# Patient Record
Sex: Female | Born: 1970
Health system: Southern US, Community
[De-identification: ages and names within clinical notes are randomized; demographics above are authoritative.]

## PROBLEM LIST (undated history)

## (undated) DIAGNOSIS — G35 Multiple sclerosis: Secondary | ICD-10-CM

## (undated) DIAGNOSIS — Z720 Tobacco use: Secondary | ICD-10-CM

## (undated) DIAGNOSIS — R002 Palpitations: Secondary | ICD-10-CM

## (undated) DIAGNOSIS — H469 Unspecified optic neuritis: Secondary | ICD-10-CM

## (undated) HISTORY — DX: Tobacco use: Z72.0

## (undated) HISTORY — DX: Multiple sclerosis: G35

## (undated) HISTORY — DX: Palpitations: R00.2

## (undated) HISTORY — PX: VEIN SURGERY: SHX48

## (undated) HISTORY — PX: GALLBLADDER SURGERY: SHX652

## (undated) HISTORY — PX: CHOLECYSTECTOMY: SHX55

## (undated) HISTORY — DX: Unspecified optic neuritis: H46.9

---

## 1999-11-23 ENCOUNTER — Ambulatory Visit (HOSPITAL_COMMUNITY): Admission: RE | Admit: 1999-11-23 | Discharge: 1999-11-23 | Payer: Self-pay | Admitting: Internal Medicine

## 1999-11-23 ENCOUNTER — Encounter: Payer: Self-pay | Admitting: Internal Medicine

## 2008-05-10 HISTORY — PX: VEIN SURGERY: SHX48

## 2008-05-23 ENCOUNTER — Encounter: Payer: Self-pay | Admitting: Cardiology

## 2009-05-10 HISTORY — PX: GALLBLADDER SURGERY: SHX652

## 2009-12-04 ENCOUNTER — Emergency Department (HOSPITAL_COMMUNITY): Admission: EM | Admit: 2009-12-04 | Discharge: 2009-12-04 | Payer: Self-pay | Admitting: Emergency Medicine

## 2010-02-09 ENCOUNTER — Encounter: Admission: RE | Admit: 2010-02-09 | Discharge: 2010-02-09 | Payer: Self-pay | Admitting: Ophthalmology

## 2010-03-11 ENCOUNTER — Ambulatory Visit (HOSPITAL_COMMUNITY): Admission: RE | Admit: 2010-03-11 | Discharge: 2010-03-11 | Payer: Self-pay | Admitting: General Surgery

## 2010-07-22 LAB — SURGICAL PCR SCREEN
MRSA, PCR: NEGATIVE
Staphylococcus aureus: NEGATIVE

## 2010-07-25 LAB — COMPREHENSIVE METABOLIC PANEL
BUN: 21 mg/dL (ref 6–23)
CO2: 28 mEq/L (ref 19–32)
Chloride: 106 mEq/L (ref 96–112)
Creatinine, Ser: 0.65 mg/dL (ref 0.4–1.2)
GFR calc non Af Amer: >60 mL/min (ref >60)
Glucose, Bld: 123 mg/dL — ABNORMAL HIGH (ref 70–99)
Total Bilirubin: 0.7 mg/dL (ref 0.3–1.2)

## 2010-07-25 LAB — DIFFERENTIAL
Basophils Absolute: 0.1 10*3/uL (ref 0.0–0.1)
Eosinophils Relative: 2 % (ref 0–5)
Lymphocytes Relative: 9 % — ABNORMAL LOW (ref 12–46)
Neutro Abs: 11.1 10*3/uL — ABNORMAL HIGH (ref 1.7–7.7)

## 2010-07-25 LAB — URINALYSIS, ROUTINE W REFLEX MICROSCOPIC
Bilirubin Urine: NEGATIVE
Nitrite: NEGATIVE
Specific Gravity, Urine: 1.029 (ref 1.005–1.030)
Urobilinogen, UA: 0.2 mg/dL (ref 0.0–1.0)

## 2010-07-25 LAB — CBC
Hemoglobin: 13.8 g/dL (ref 12.0–15.0)
MCH: 32.9 pg (ref 26.0–34.0)
MCV: 94.7 fL (ref 78.0–100.0)
RBC: 4.2 MIL/uL (ref 3.87–5.11)

## 2010-07-25 LAB — LIPASE, BLOOD: Lipase: 24 U/L (ref 11–59)

## 2011-02-11 ENCOUNTER — Other Ambulatory Visit: Payer: Self-pay | Admitting: Physician Assistant

## 2011-02-11 ENCOUNTER — Other Ambulatory Visit (HOSPITAL_COMMUNITY)
Admission: RE | Admit: 2011-02-11 | Discharge: 2011-02-11 | Disposition: A | Discharge status: Home / Self Care | Payer: BC Managed Care – PPO | Source: Ambulatory Visit | Attending: Family Medicine | Admitting: Family Medicine

## 2011-02-11 DIAGNOSIS — Z1159 Encounter for screening for other viral diseases: Secondary | ICD-10-CM | POA: Insufficient documentation

## 2011-02-11 DIAGNOSIS — Z0000 Encounter for general adult medical examination without abnormal findings: Secondary | ICD-10-CM | POA: Insufficient documentation

## 2011-10-13 ENCOUNTER — Encounter: Payer: Self-pay | Admitting: Cardiology

## 2012-02-04 ENCOUNTER — Other Ambulatory Visit: Payer: Self-pay | Admitting: Ophthalmology

## 2012-02-04 DIAGNOSIS — H547 Unspecified visual loss: Secondary | ICD-10-CM

## 2012-02-07 ENCOUNTER — Ambulatory Visit
Admission: RE | Admit: 2012-02-07 | Discharge: 2012-02-07 | Disposition: A | Discharge status: Home / Self Care | Payer: BC Managed Care – PPO | Source: Ambulatory Visit | Attending: Ophthalmology | Admitting: Ophthalmology

## 2012-02-07 DIAGNOSIS — H547 Unspecified visual loss: Secondary | ICD-10-CM

## 2012-02-07 MED ORDER — GADOBENATE DIMEGLUMINE 529 MG/ML IV SOLN
17.0000 mL | Freq: Once | INTRAVENOUS | Status: AC | PRN
  Administered 2012-02-07: 17 mL via INTRAVENOUS

## 2012-04-14 ENCOUNTER — Other Ambulatory Visit (HOSPITAL_COMMUNITY)
Admission: RE | Admit: 2012-04-14 | Discharge: 2012-04-14 | Disposition: A | Discharge status: Home / Self Care | Payer: BC Managed Care – PPO | Source: Ambulatory Visit | Attending: Family Medicine | Admitting: Family Medicine

## 2012-04-14 ENCOUNTER — Other Ambulatory Visit: Payer: Self-pay | Admitting: Physician Assistant

## 2012-04-14 DIAGNOSIS — Z0000 Encounter for general adult medical examination without abnormal findings: Secondary | ICD-10-CM | POA: Insufficient documentation

## 2012-05-12 ENCOUNTER — Other Ambulatory Visit: Payer: Self-pay | Admitting: Neurology

## 2012-05-12 DIAGNOSIS — H469 Unspecified optic neuritis: Secondary | ICD-10-CM

## 2012-05-15 ENCOUNTER — Ambulatory Visit
Admission: RE | Admit: 2012-05-15 | Discharge: 2012-05-15 | Disposition: A | Discharge status: Home / Self Care | Payer: PRIVATE HEALTH INSURANCE | Source: Ambulatory Visit | Attending: Neurology | Admitting: Neurology

## 2012-05-15 ENCOUNTER — Other Ambulatory Visit: Payer: Self-pay | Admitting: Neurology

## 2012-05-15 VITALS — BP 104/55 | HR 63

## 2012-05-15 DIAGNOSIS — H469 Unspecified optic neuritis: Secondary | ICD-10-CM

## 2012-05-15 NOTE — Progress Notes (Signed)
Blood drawn to go with spinal fluid procedure, blood taken from right AC, site unremarkable and pt tolerated procedure well.

## 2012-05-19 LAB — CSF PANEL II
Total Protein, CSF: 32 mg/dL (ref 15–45)
Tube #: 4
WBC, CSF: 0 cu mm (ref 0–5)

## 2012-05-24 LAB — CNS IGG SYNTHESIS RATE, CSF+BLOOD
Albumin, CSF: 15 mg/dL (ref 8.0–42.0)
IgG Index, CSF: 0.76 — ABNORMAL HIGH (ref <0.66)
IgG, CSF: 2.2 mg/dL (ref 0.8–7.7)
IgG, Serum: 833 mg/dL (ref 694–1618)
MS CNS IgG Synthesis Rate: 1.3 mg/24 h (ref <=3.3)

## 2012-06-24 ENCOUNTER — Other Ambulatory Visit: Payer: Self-pay

## 2012-07-26 ENCOUNTER — Telehealth: Payer: Self-pay

## 2012-07-26 NOTE — Telephone Encounter (Signed)
Stephanie Mcneil, please give her an appointment to discuss treatment, finish Tecfidera she has.

## 2012-07-26 NOTE — Telephone Encounter (Signed)
Spoke with patient and told her to count ine tecfidera and she is coming for follow  up  04/01/  2014 at 8:00

## 2012-07-26 NOTE — Telephone Encounter (Addendum)
Patient states her tecfidera was denied. Patient has 20 tablets left. Patient wants to know will she be approved  Or does she need something else ?

## 2012-07-31 ENCOUNTER — Telehealth: Payer: Self-pay

## 2012-07-31 NOTE — Telephone Encounter (Signed)
Patient called wanted to no if her Tecfidera was approved.  Spoke with Samule Ohm was approved now. Patient was informed.

## 2012-08-08 ENCOUNTER — Encounter: Payer: Self-pay | Admitting: Neurology

## 2012-08-08 ENCOUNTER — Ambulatory Visit: Payer: Self-pay | Admitting: Neurology

## 2012-08-08 DIAGNOSIS — H469 Unspecified optic neuritis: Secondary | ICD-10-CM | POA: Insufficient documentation

## 2012-08-08 DIAGNOSIS — G35 Multiple sclerosis: Secondary | ICD-10-CM

## 2012-10-31 ENCOUNTER — Ambulatory Visit (INDEPENDENT_AMBULATORY_CARE_PROVIDER_SITE_OTHER): Payer: No Typology Code available for payment source | Admitting: Neurology

## 2012-10-31 ENCOUNTER — Encounter: Payer: Self-pay | Admitting: Neurology

## 2012-10-31 VITALS — BP 102/61 | HR 78 | Ht 66.5 in | Wt 189.0 lb

## 2012-10-31 DIAGNOSIS — G35 Multiple sclerosis: Secondary | ICD-10-CM | POA: Insufficient documentation

## 2012-10-31 DIAGNOSIS — F17200 Nicotine dependence, unspecified, uncomplicated: Secondary | ICD-10-CM

## 2012-10-31 DIAGNOSIS — Z720 Tobacco use: Secondary | ICD-10-CM | POA: Insufficient documentation
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
## 2013-03-15 ENCOUNTER — Other Ambulatory Visit: Payer: Self-pay

## 2013-03-26 ENCOUNTER — Encounter: Payer: Self-pay | Admitting: Obstetrics & Gynecology

## 2013-03-26 ENCOUNTER — Ambulatory Visit (INDEPENDENT_AMBULATORY_CARE_PROVIDER_SITE_OTHER): Payer: No Typology Code available for payment source | Admitting: Obstetrics & Gynecology

## 2013-03-26 VITALS — BP 108/69 | HR 65 | Temp 98.1°F | Ht 67.0 in | Wt 196.0 lb

## 2013-03-26 DIAGNOSIS — Z3202 Encounter for pregnancy test, result negative: Secondary | ICD-10-CM

## 2013-03-26 DIAGNOSIS — Z30430 Encounter for insertion of intrauterine contraceptive device: Secondary | ICD-10-CM

## 2013-03-26 DIAGNOSIS — IMO0001 Reserved for inherently not codable concepts without codable children: Secondary | ICD-10-CM

## 2013-03-26 DIAGNOSIS — Z30432 Encounter for removal of intrauterine contraceptive device: Secondary | ICD-10-CM

## 2013-03-26 LAB — POCT URINALYSIS DIPSTICK
Bilirubin, UA: NEGATIVE
Glucose, UA: NEGATIVE
Leukocytes, UA: NEGATIVE
Nitrite, UA: NEGATIVE

## 2013-03-26 LAB — POCT URINE PREGNANCY: Preg Test, Ur: NEGATIVE

## 2013-03-26 MED ORDER — DOXYCYCLINE HYCLATE 100 MG PO TABS: 100.0000 mg | ORAL_TABLET | Freq: Two times a day (BID) | ORAL | Status: DC

## 2013-03-26 NOTE — Progress Notes (Signed)
IUD Removal/Insertion Procedure Note  Pre-operative Diagnosis: IUD insitu  Post-operative Diagnosis: same, cervical stenosis  Indications: contraception  Procedure Details  The risks (including infection, bleeding, pain, and uterine perforation) and benefits of the procedure were explained to the patient and Written informed consent was obtained.    Cervix cleansed with Betadine. The IUD was removed intact.  Difficulty introducing the uterine sound initially.The cervix was dilated with Shawnie Pons dilators.  Uterus sounded to 8 cm. IUD inserted without difficulty. String visible and trimmed. Patient tolerated procedure well.  An informal U/S showed appropriate positioning.  IUD Information: Mirena.  Condition: Stable  Complications: None  Plan:  The patient was advised to call for any fever or for prolonged or severe pain or bleeding. She was advised to use OTC acetaminophen as needed for mild to moderate pain.

## 2013-03-26 NOTE — Progress Notes (Deleted)
Subjective:     Stephanie Mcneil is a 42 y.o. female here for a routine exam.  Current complaints: denies.  Personal health questionnaire reviewed: yes.   Gynecologic History Patient's last menstrual period was 03/20/2013. Contraception: IUD Last Pap: 04/2012 Results were: normal Last mammogram: N/A  Obstetric History OB History  No data available     {Common ambulatory SmartLinks:19316} .ljm Review of Systems {ros; complete:30496}    Objective:    {exam; complete:18323}    Assessment:    Healthy female exam.    Plan:    {plan:19193}

## 2013-05-17 ENCOUNTER — Ambulatory Visit (INDEPENDENT_AMBULATORY_CARE_PROVIDER_SITE_OTHER): Payer: No Typology Code available for payment source | Admitting: Obstetrics & Gynecology

## 2013-05-17 ENCOUNTER — Encounter: Payer: Self-pay | Admitting: Obstetrics & Gynecology

## 2013-05-17 VITALS — BP 105/69 | HR 81 | Temp 98.3°F | Wt 197.0 lb

## 2013-05-17 DIAGNOSIS — Z30431 Encounter for routine checking of intrauterine contraceptive device: Secondary | ICD-10-CM

## 2013-05-17 NOTE — Progress Notes (Signed)
Subjective:     Stephanie Mcneil is a 43 y.o. female here for a follow up exam following IUD insertion.  Current complaints: Pt has no complaints today.  Pt is in office for IUD check.  Pt is still having menstral cycles.  Personal health questionnaire reviewed: yes.   Gynecologic History Patient's last menstrual period was 05/10/2013. Contraception: IUD Last Pap: 04/2012. Results were: normal Last mammogram: n/a  Obstetric History OB History  No data available     The following portions of the patient's history were reviewed and updated as appropriate: allergies, current medications, past family history, past medical history, past social history, past surgical history and problem list.  Review of Systems Pertinent items are noted in HPI.    Objective:     SPEC:  IUD strings present Bimanual:  Uterus NT, adnexa NT     Assessment:    Normal IUD check  Plan:   Return prn

## 2013-07-02 ENCOUNTER — Encounter: Payer: Self-pay | Admitting: Obstetrics & Gynecology

## 2013-07-18 ENCOUNTER — Telehealth: Payer: Self-pay | Admitting: Neurology

## 2013-07-18 NOTE — Telephone Encounter (Signed)
I will be happy to send the Rx now, but will need to know what pharmacy.  I called the patient back.  She said she was able to get everything figured out and they already have the Rx.  Says they should be processing it for her, and she will call them back in one hour to verify.  If anything further is needed, she will call us back.

## 2013-07-18 NOTE — Telephone Encounter (Signed)
Patient called to state that she only has 2 days left of her Tecfidera, wanted to inform us that we will be getting a fax about a mail order transfer and to look out for it.

## 2013-08-17 ENCOUNTER — Telehealth: Payer: Self-pay | Admitting: Neurology

## 2013-08-17 NOTE — Telephone Encounter (Signed)
Patient is f/u on pre authorizaton for Tecfidera--please call patient--thank you.

## 2013-08-17 NOTE — Telephone Encounter (Signed)
We were not aware a PA was needed prior to the patient calling.  I have submitted all info to insurance today, pending their response.  I called the patient back.  She is aware.

## 2013-08-17 NOTE — Telephone Encounter (Signed)
Lawson FiscalLori with [facility] called.  She wanted to check the status of the fax for prior authorization for the patient's Dimethyl Fumarate (TECFIDERA).  Please call to discuss.  Thank you

## 2013-08-17 NOTE — Telephone Encounter (Signed)
All info was faxed to ins earlier today (please see previous note), pending ins response.  I called back.  Spoke with Lawson Fiscal.  She is aware.

## 2013-10-01 ENCOUNTER — Other Ambulatory Visit: Payer: Self-pay | Admitting: Neurology

## 2013-11-24 ENCOUNTER — Other Ambulatory Visit: Payer: Self-pay | Admitting: Neurology

## 2013-12-17 ENCOUNTER — Other Ambulatory Visit: Payer: Self-pay | Admitting: Neurology

## 2014-01-17 ENCOUNTER — Other Ambulatory Visit: Payer: Self-pay | Admitting: Neurology

## 2014-02-18 ENCOUNTER — Other Ambulatory Visit: Payer: Self-pay | Admitting: Neurology

## 2014-02-22 ENCOUNTER — Other Ambulatory Visit: Payer: Self-pay

## 2014-03-15 ENCOUNTER — Encounter: Payer: Self-pay | Admitting: Neurology

## 2014-03-15 ENCOUNTER — Ambulatory Visit (INDEPENDENT_AMBULATORY_CARE_PROVIDER_SITE_OTHER): Payer: No Typology Code available for payment source | Admitting: Neurology

## 2014-03-15 VITALS — BP 109/69 | HR 80 | Ht 65.5 in | Wt 201.0 lb

## 2014-03-15 DIAGNOSIS — G35 Multiple sclerosis: Secondary | ICD-10-CM

## 2014-03-15 DIAGNOSIS — H469 Unspecified optic neuritis: Secondary | ICD-10-CM

## 2014-03-15 MED ORDER — DIMETHYL FUMARATE 240 MG PO CPDR: 240.0000 mg | DELAYED_RELEASE_CAPSULE | Freq: Two times a day (BID) | ORAL | Status: DC
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
## 2014-03-21 ENCOUNTER — Ambulatory Visit (INDEPENDENT_AMBULATORY_CARE_PROVIDER_SITE_OTHER): Payer: No Typology Code available for payment source

## 2014-03-21 DIAGNOSIS — H469 Unspecified optic neuritis: Secondary | ICD-10-CM

## 2014-03-21 DIAGNOSIS — G35 Multiple sclerosis: Secondary | ICD-10-CM

## 2014-03-22 MED ORDER — GADOPENTETATE DIMEGLUMINE 469.01 MG/ML IV SOLN: 19.0000 mL | Freq: Once | INTRAVENOUS | Status: AC | PRN

## 2014-03-25 ENCOUNTER — Telehealth: Payer: Self-pay | Admitting: Neurology

## 2014-03-25 NOTE — Telephone Encounter (Signed)
I have called her MRI report, Slightly abnormal MRI scan of the brain showing a solitary tiny nonspecific right posterior frontal white matter hyperintensity with differential discussed above. No enhancing lesions are noted.  There was no lesion at her MRI of cervical  She is taking Tecfidera , doing well clinically  Stephanie Mcneil: Please change her appointment to one year, with nurse practitioner

## 2014-03-26 NOTE — Telephone Encounter (Signed)
Called patient and tried to schedule her for next year. Patient states she will call back and schedule.

## 2014-04-22 ENCOUNTER — Ambulatory Visit: Payer: No Typology Code available for payment source | Admitting: Neurology

## 2014-05-06 ENCOUNTER — Encounter: Payer: Self-pay | Admitting: *Deleted

## 2014-05-28 ENCOUNTER — Telehealth: Payer: Self-pay | Admitting: Neurology

## 2014-05-28 MED ORDER — DIMETHYL FUMARATE 240 MG PO CPDR: 240.0000 mg | DELAYED_RELEASE_CAPSULE | Freq: Two times a day (BID) | ORAL | Status: DC

## 2014-05-28 NOTE — Telephone Encounter (Signed)
Patient stated [facility] requesting Rx for Dimethyl Fumarate (TECFIDERA) 240 MG CPDR.  Fax # [phone] and phone # [phone].  Please call and advise.

## 2014-05-28 NOTE — Telephone Encounter (Signed)
Rx has been sent.  I called the patient back.  She is aware.  

## 2014-05-30 ENCOUNTER — Telehealth: Payer: Self-pay | Admitting: Neurology

## 2014-05-30 NOTE — Telephone Encounter (Signed)
Elease Hashimoto is calling needing to know if pt needs the starter dose for Dimethyl Fumarate (TECFIDERA) 240 MG CPDR. Please call and advise [phone].

## 2014-05-30 NOTE — Telephone Encounter (Signed)
The patient has been taking this medication for quite some time.  I called back.  Spoke with Gavin Pound who was not able to assist me and transferred me to [place].  He was also unable to assist me and transferred me to [place].  I verified info with her and they will proceed with order.

## 2014-06-04 ENCOUNTER — Telehealth: Payer: Self-pay | Admitting: Neurology

## 2014-06-04 NOTE — Telephone Encounter (Signed)
I attempted to process Prior Auth, however, ins is requesting new ID #.  They will not provide [place] with the info.  I called the patient.  She said her ID # is [id number].  I have submitted all required info to ins with this info.  Request is currently under review.  They will notify patient of outcome once a decision has been made.

## 2014-06-04 NOTE — Telephone Encounter (Signed)
Pt is calling stating she needs prior auth sent to insurance for Dimethyl Fumarate (TECFIDERA) 240 MG CPDR. If you have any questions please call patient.

## 2015-04-14 ENCOUNTER — Encounter: Payer: Self-pay | Admitting: Neurology

## 2015-04-14 ENCOUNTER — Ambulatory Visit (INDEPENDENT_AMBULATORY_CARE_PROVIDER_SITE_OTHER): Payer: BLUE CROSS/BLUE SHIELD | Admitting: Neurology

## 2015-04-14 VITALS — BP 110/67 | HR 93 | Ht 65.5 in | Wt 196.0 lb

## 2015-04-14 DIAGNOSIS — H469 Unspecified optic neuritis: Secondary | ICD-10-CM

## 2015-04-14 DIAGNOSIS — G35 Multiple sclerosis: Secondary | ICD-10-CM | POA: Diagnosis not present
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
## 2015-04-25 ENCOUNTER — Telehealth: Payer: Self-pay | Admitting: Neurology

## 2015-04-25 NOTE — Telephone Encounter (Signed)
[employer] Designer, industrial/product will call her back.

## 2015-04-25 NOTE — Telephone Encounter (Signed)
Patient returned call

## 2015-05-14 ENCOUNTER — Telehealth: Payer: Self-pay | Admitting: Neurology

## 2015-05-14 DIAGNOSIS — G35 Multiple sclerosis: Secondary | ICD-10-CM

## 2015-05-14 NOTE — Telephone Encounter (Signed)
Patient came in for screening visit  Study number [id number]  MRI brain w/wo order placed.

## 2015-05-21 ENCOUNTER — Ambulatory Visit
Admission: RE | Admit: 2015-05-21 | Discharge: 2015-05-21 | Disposition: A | Discharge status: Home / Self Care | Payer: No Typology Code available for payment source | Source: Ambulatory Visit | Attending: Neurology | Admitting: Neurology

## 2015-05-21 DIAGNOSIS — G35 Multiple sclerosis: Secondary | ICD-10-CM

## 2015-06-04 ENCOUNTER — Encounter: Payer: BLUE CROSS/BLUE SHIELD | Admitting: Neurology

## 2015-06-18 ENCOUNTER — Encounter: Payer: Self-pay | Admitting: Neurology

## 2015-06-18 ENCOUNTER — Ambulatory Visit (INDEPENDENT_AMBULATORY_CARE_PROVIDER_SITE_OTHER): Payer: BLUE CROSS/BLUE SHIELD | Admitting: Neurology

## 2015-06-18 DIAGNOSIS — G35 Multiple sclerosis: Secondary | ICD-10-CM

## 2015-06-18 DIAGNOSIS — Z0289 Encounter for other administrative examinations: Secondary | ICD-10-CM

## 2015-06-19 NOTE — Progress Notes (Signed)
Patient came in per research protocol, was documented separately

## 2015-06-25 ENCOUNTER — Other Ambulatory Visit: Payer: Self-pay | Admitting: Neurology

## 2015-07-02 ENCOUNTER — Ambulatory Visit (INDEPENDENT_AMBULATORY_CARE_PROVIDER_SITE_OTHER): Payer: Self-pay | Admitting: Neurology

## 2015-07-02 ENCOUNTER — Encounter: Payer: Self-pay | Admitting: Neurology

## 2015-07-02 DIAGNOSIS — Z0289 Encounter for other administrative examinations: Secondary | ICD-10-CM

## 2015-07-02 DIAGNOSIS — G35 Multiple sclerosis: Secondary | ICD-10-CM

## 2015-07-04 NOTE — Progress Notes (Signed)
See separate research documentation 

## 2015-07-10 ENCOUNTER — Encounter: Payer: Self-pay | Admitting: Neurology

## 2015-07-30 ENCOUNTER — Encounter: Payer: Self-pay | Admitting: Neurology

## 2015-07-30 ENCOUNTER — Ambulatory Visit (INDEPENDENT_AMBULATORY_CARE_PROVIDER_SITE_OTHER): Payer: Self-pay | Admitting: Neurology

## 2015-07-30 DIAGNOSIS — G35 Multiple sclerosis: Secondary | ICD-10-CM

## 2015-07-30 DIAGNOSIS — Z0289 Encounter for other administrative examinations: Secondary | ICD-10-CM

## 2015-08-01 NOTE — Progress Notes (Signed)
See separate research documentation 

## 2015-08-27 ENCOUNTER — Ambulatory Visit (INDEPENDENT_AMBULATORY_CARE_PROVIDER_SITE_OTHER): Payer: Self-pay | Admitting: Neurology

## 2015-08-27 ENCOUNTER — Encounter: Payer: Self-pay | Admitting: Neurology

## 2015-08-27 DIAGNOSIS — H469 Unspecified optic neuritis: Secondary | ICD-10-CM

## 2015-08-27 DIAGNOSIS — G35 Multiple sclerosis: Secondary | ICD-10-CM

## 2015-08-27 NOTE — Progress Notes (Signed)
Please see separate research documentation

## 2015-09-24 ENCOUNTER — Encounter: Payer: Self-pay | Admitting: Neurology

## 2015-09-24 ENCOUNTER — Ambulatory Visit (INDEPENDENT_AMBULATORY_CARE_PROVIDER_SITE_OTHER): Payer: Self-pay | Admitting: Neurology

## 2015-09-24 DIAGNOSIS — G35 Multiple sclerosis: Secondary | ICD-10-CM

## 2015-09-24 NOTE — Progress Notes (Signed)
Patient returned for research evaluation, was documented separately

## 2015-10-22 ENCOUNTER — Encounter (INDEPENDENT_AMBULATORY_CARE_PROVIDER_SITE_OTHER): Payer: Self-pay | Admitting: Neurology

## 2015-10-22 ENCOUNTER — Encounter: Payer: Self-pay | Admitting: Neurology

## 2015-10-22 DIAGNOSIS — G35 Multiple sclerosis: Secondary | ICD-10-CM

## 2015-10-22 DIAGNOSIS — Z0289 Encounter for other administrative examinations: Secondary | ICD-10-CM

## 2015-10-22 NOTE — Progress Notes (Deleted)
Subjective:    Patient ID: Stephanie Mcneil is a 45 y.o. female.  HPI {Common ambulatory SmartLinks:19316}  Review of Systems  Objective:  Neurologic Exam  Physical Exam  Assessment:   ***  Plan:   ***

## 2015-11-24 ENCOUNTER — Encounter (INDEPENDENT_AMBULATORY_CARE_PROVIDER_SITE_OTHER): Payer: Self-pay | Admitting: Neurology

## 2015-11-24 DIAGNOSIS — Z0289 Encounter for other administrative examinations: Secondary | ICD-10-CM

## 2015-11-24 DIAGNOSIS — I050 Rheumatic mitral stenosis: Secondary | ICD-10-CM

## 2015-11-24 NOTE — Progress Notes (Signed)
Subjective:    Patient ID: Stephanie Mcneil is a 45 y.o. female.  HPI Research visit document separately  Review of Systems  Objective:  Neurologic Exam  Physical Exam  Assessment:      Plan:

## 2016-02-11 ENCOUNTER — Encounter (INDEPENDENT_AMBULATORY_CARE_PROVIDER_SITE_OTHER): Payer: Self-pay | Admitting: Neurology

## 2016-02-11 DIAGNOSIS — Z0289 Encounter for other administrative examinations: Secondary | ICD-10-CM

## 2016-02-25 ENCOUNTER — Other Ambulatory Visit: Payer: Self-pay | Admitting: Neurology

## 2016-02-25 DIAGNOSIS — G35 Multiple sclerosis: Secondary | ICD-10-CM

## 2016-02-25 NOTE — Progress Notes (Signed)
MRI brain w/wo for research.

## 2016-03-18 ENCOUNTER — Ambulatory Visit: Payer: Self-pay | Admitting: Obstetrics & Gynecology

## 2016-04-05 ENCOUNTER — Encounter: Payer: Self-pay | Admitting: Obstetrics and Gynecology

## 2016-04-05 ENCOUNTER — Ambulatory Visit (INDEPENDENT_AMBULATORY_CARE_PROVIDER_SITE_OTHER): Payer: Managed Care, Other (non HMO) | Admitting: Obstetrics and Gynecology

## 2016-04-05 ENCOUNTER — Other Ambulatory Visit: Payer: Self-pay | Admitting: Obstetrics and Gynecology

## 2016-04-05 VITALS — BP 111/77 | HR 77 | Temp 98.2°F | Ht 66.0 in | Wt 222.4 lb

## 2016-04-05 DIAGNOSIS — Z01419 Encounter for gynecological examination (general) (routine) without abnormal findings: Secondary | ICD-10-CM | POA: Diagnosis not present

## 2016-04-05 DIAGNOSIS — Z1231 Encounter for screening mammogram for malignant neoplasm of breast: Secondary | ICD-10-CM

## 2016-04-05 DIAGNOSIS — Z1151 Encounter for screening for human papillomavirus (HPV): Secondary | ICD-10-CM | POA: Diagnosis not present

## 2016-04-05 DIAGNOSIS — Z124 Encounter for screening for malignant neoplasm of cervix: Secondary | ICD-10-CM

## 2016-04-05 NOTE — Progress Notes (Signed)
Subjective:     Stephanie Mcneil is a 45 y.o. female who is here for a comprehensive physical exam. The patient reports no problems. She is sexually active using Mirena IUD for contraception. She reports 4 days of vaginal bleeding. She denies any abdominal/pelvic pain or abnormal vaginal discharge.   Past Medical History:  Diagnosis Date  . MS (multiple sclerosis) (HCC)   . Multiple sclerosis (HCC)   . Optic neuritis, unspecified   . Palpitations    Symptoms of palpitations possibly related to increased stress  . Tobacco use    and caffeine   Past Surgical History:  Procedure Laterality Date  . CESAREAN SECTION  1993 and 2000  . CESAREAN SECTION    . CHOLECYSTECTOMY    . GALLBLADDER SURGERY  2011  . GALLBLADDER SURGERY    . VEIN SURGERY Right 2010  . VEIN SURGERY Right    Family History  Problem Relation Age of Onset  . Cancer Maternal Grandmother   . Bradycardia Mother   . High blood pressure Mother   . High blood pressure Father     Social History   Social History  . Marital status: Married    Spouse name: Stephanie Mcneil  . Number of children: 2  . Years of education: 12   Occupational History  .  [employer] officer, community   Social History Main Topics  . Smoking status: Current Every Day Smoker    Packs/day: 0.80    Years: 20.00    Types: Cigarettes  . Smokeless tobacco: Never Used     Comment: trying to smoke less per day  . Alcohol use No     Comment: Socail Drink  . Drug use: No  . Sexual activity: Yes    Partners: Male    Birth control/ protection: IUD   Other Topics Concern  . Not on file   Social History Narrative   ** Merged History Encounter **       Patient lives at home with her husband Stephanie Mcneil). 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   Right handed.   Caffeine-2   Health Maintenance  Topic Date Due  . HIV Screening  11/11/1986  . TETANUS/TDAP  11/11/1990  . PAP SMEAR  04/15/2015  . INFLUENZA VACCINE  12/09/2015        Review of Systems Pertinent items are noted in HPI.   Objective:  Blood pressure 111/77, pulse 77, temperature 98.2 F (36.8 C), temperature source Oral, height 5\' 6"  (1.676 m), weight 222 lb 6.4 oz (100.9 kg), last menstrual period 03/29/2016.      GENERAL: Well-developed, well-nourished female in no acute distress.  HEENT: Normocephalic, atraumatic. Sclerae anicteric.  NECK: Supple. Normal thyroid.  LUNGS: Clear to auscultation bilaterally.  HEART: Regular rate and rhythm. BREASTS: Symmetric in size. No palpable masses or lymphadenopathy, skin changes, or nipple drainage. ABDOMEN: Soft, nontender, nondistended. No organomegaly. PELVIC: Normal external female genitalia. Vagina is pink and rugated.  Normal discharge. Normal appearing cervix. Uterus is normal in size.  No adnexal mass or tenderness. EXTREMITIES: No cyanosis, clubbing, or edema, 2+ distal pulses.  Assessment:    Healthy female exam.      Plan:    pap smear collected Screening mammogram ordered Patient advised to perform a monthly breast exam Patient will be contacted with any abnormal results See After Visit Summary for Counseling Recommendations

## 2016-04-05 NOTE — Progress Notes (Signed)
Patient is in the office for annual exam. 

## 2016-04-08 LAB — CYTOLOGY - PAP
Diagnosis: UNDETERMINED — AB
HPV (WINDOPATH): NOT DETECTED

## 2016-04-27 ENCOUNTER — Ambulatory Visit
Admission: RE | Admit: 2016-04-27 | Discharge: 2016-04-27 | Disposition: A | Discharge status: Home / Self Care | Payer: Managed Care, Other (non HMO) | Source: Ambulatory Visit | Attending: Obstetrics and Gynecology | Admitting: Obstetrics and Gynecology

## 2016-04-27 DIAGNOSIS — Z1231 Encounter for screening mammogram for malignant neoplasm of breast: Secondary | ICD-10-CM

## 2016-05-05 ENCOUNTER — Encounter: Payer: Self-pay | Admitting: Neurology

## 2016-05-05 ENCOUNTER — Ambulatory Visit
Admission: RE | Admit: 2016-05-05 | Discharge: 2016-05-05 | Disposition: A | Discharge status: Home / Self Care | Payer: No Typology Code available for payment source | Source: Ambulatory Visit | Attending: Neurology | Admitting: Neurology

## 2016-05-05 DIAGNOSIS — G35 Multiple sclerosis: Secondary | ICD-10-CM

## 2016-05-06 ENCOUNTER — Encounter: Payer: Self-pay | Admitting: Neurology

## 2016-05-12 ENCOUNTER — Encounter: Payer: Self-pay | Admitting: Neurology

## 2016-07-28 ENCOUNTER — Encounter (INDEPENDENT_AMBULATORY_CARE_PROVIDER_SITE_OTHER): Payer: Self-pay | Admitting: Neurology

## 2016-07-28 DIAGNOSIS — Z0289 Encounter for other administrative examinations: Secondary | ICD-10-CM

## 2016-07-28 DIAGNOSIS — G35 Multiple sclerosis: Secondary | ICD-10-CM

## 2016-07-28 NOTE — Progress Notes (Signed)
See separate research documentation 

## 2016-10-18 ENCOUNTER — Encounter (INDEPENDENT_AMBULATORY_CARE_PROVIDER_SITE_OTHER): Payer: Self-pay | Admitting: Neurology

## 2016-10-18 DIAGNOSIS — Z0289 Encounter for other administrative examinations: Secondary | ICD-10-CM

## 2016-10-21 ENCOUNTER — Encounter: Payer: Self-pay | Admitting: Neurology

## 2017-01-17 ENCOUNTER — Encounter (INDEPENDENT_AMBULATORY_CARE_PROVIDER_SITE_OTHER): Payer: Self-pay | Admitting: Neurology

## 2017-01-17 DIAGNOSIS — Z0289 Encounter for other administrative examinations: Secondary | ICD-10-CM

## 2017-01-17 DIAGNOSIS — G35 Multiple sclerosis: Secondary | ICD-10-CM

## 2017-01-17 NOTE — Progress Notes (Signed)
See separate research report

## 2017-01-18 ENCOUNTER — Encounter: Payer: Self-pay | Admitting: *Deleted

## 2017-01-18 ENCOUNTER — Telehealth: Payer: Self-pay | Admitting: Neurology

## 2017-01-18 NOTE — Telephone Encounter (Signed)
Received confirmation from [facility] that Stephanie Mcneil has been approved by her insurance plan.  Approval valid through 05/09/2028.  Case VQ#[id number].  Her insurance plan requires the specialty medication to be filled at [facility].  Her prescription has been forwarded to that pharmacy.

## 2017-01-18 NOTE — Telephone Encounter (Signed)
Stephanie Mcneil with [facility] called office in reference to TECFIDERA 240 MG CPDR.  [name] would like to know the start date of the medication and any recent clinical notes.  Fax #- [phone].  Please call Stephanie Mcneil

## 2017-01-25 ENCOUNTER — Telehealth: Payer: Self-pay | Admitting: Neurology

## 2017-01-25 NOTE — Telephone Encounter (Signed)
Pt calling re: her research medicaiton,she has already about 3 months of a supply.  She is getting calls about ordering more.  Pt is asking for a call back re: should she order at this point or wait since she has a 3 month supply currently. Please call

## 2017-01-25 NOTE — Telephone Encounter (Signed)
She has received a call from Tecfidera letting her know she has been approved.  She is requesting it to be shipped so it will be available to her when she transitions from research medication to Tecfidera.

## 2017-04-07 ENCOUNTER — Other Ambulatory Visit: Payer: Self-pay | Admitting: Neurology

## 2017-04-07 DIAGNOSIS — G35 Multiple sclerosis: Secondary | ICD-10-CM

## 2017-04-11 ENCOUNTER — Encounter (INDEPENDENT_AMBULATORY_CARE_PROVIDER_SITE_OTHER): Payer: Self-pay | Admitting: Neurology

## 2017-04-11 DIAGNOSIS — Z0289 Encounter for other administrative examinations: Secondary | ICD-10-CM

## 2017-04-14 ENCOUNTER — Other Ambulatory Visit: Payer: No Typology Code available for payment source

## 2017-04-15 ENCOUNTER — Ambulatory Visit
Admission: RE | Admit: 2017-04-15 | Discharge: 2017-04-15 | Disposition: A | Discharge status: Home / Self Care | Payer: No Typology Code available for payment source | Source: Ambulatory Visit | Attending: Neurology | Admitting: Neurology

## 2017-04-15 DIAGNOSIS — G35 Multiple sclerosis: Secondary | ICD-10-CM

## 2017-04-25 ENCOUNTER — Encounter (INDEPENDENT_AMBULATORY_CARE_PROVIDER_SITE_OTHER): Payer: Self-pay | Admitting: Neurology

## 2017-04-25 DIAGNOSIS — Z0289 Encounter for other administrative examinations: Secondary | ICD-10-CM

## 2017-06-06 ENCOUNTER — Telehealth: Payer: Self-pay | Admitting: Neurology

## 2017-06-06 NOTE — Telephone Encounter (Signed)
Failed to reach patient or leave a message.  During lunch I told you weight decrease, actually subjects weight increased  Stephanie Mcneil             MRN [id number] Baseline/Screening (04Jan2017) was Weight 89.2 kg  Visit 15 (03Dec2018) Weight 101 kg, which is >10% increase.

## 2017-12-29 ENCOUNTER — Other Ambulatory Visit: Payer: Self-pay | Admitting: Neurology

## 2018-03-20 ENCOUNTER — Other Ambulatory Visit: Payer: Self-pay | Admitting: Neurology

## 2018-06-15 ENCOUNTER — Encounter: Payer: Self-pay | Admitting: Neurology

## 2018-06-15 ENCOUNTER — Encounter

## 2018-06-15 ENCOUNTER — Ambulatory Visit: Payer: BLUE CROSS/BLUE SHIELD | Admitting: Neurology

## 2018-06-15 VITALS — BP 120/80 | HR 69 | Ht 66.0 in | Wt 239.0 lb

## 2018-06-15 DIAGNOSIS — G35 Multiple sclerosis: Secondary | ICD-10-CM | POA: Diagnosis not present

## 2018-06-15 DIAGNOSIS — H469 Unspecified optic neuritis: Secondary | ICD-10-CM
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
## 2018-06-16 ENCOUNTER — Telehealth: Payer: Self-pay | Admitting: Neurology

## 2018-06-16 LAB — COMPREHENSIVE METABOLIC PANEL
ALT: 29 IU/L (ref 0–32)
AST: 21 IU/L (ref 0–40)
Albumin/Globulin Ratio: 1.8 (ref 1.2–2.2)
Albumin: 4.1 g/dL (ref 3.8–4.8)
Alkaline Phosphatase: 62 IU/L (ref 39–117)
BUN/Creatinine Ratio: 28 — ABNORMAL HIGH (ref 9–23)
BUN: 21 mg/dL (ref 6–24)
Bilirubin Total: <0.2 mg/dL (ref 0.0–1.2)
CO2: 22 mmol/L (ref 20–29)
Calcium: 8.9 mg/dL (ref 8.7–10.2)
Chloride: 103 mmol/L (ref 96–106)
Creatinine, Ser: 0.76 mg/dL (ref 0.57–1.00)
GFR calc Af Amer: 109 mL/min/{1.73_m2} (ref >59)
GFR, EST NON AFRICAN AMERICAN: 94 mL/min/{1.73_m2} (ref >59)
Globulin, Total: 2.3 g/dL (ref 1.5–4.5)
Glucose: 88 mg/dL (ref 65–99)
Potassium: 4.5 mmol/L (ref 3.5–5.2)
Sodium: 139 mmol/L (ref 134–144)
TOTAL PROTEIN: 6.4 g/dL (ref 6.0–8.5)

## 2018-06-16 LAB — CBC WITH DIFFERENTIAL/PLATELET
Basophils Absolute: 0.1 10*3/uL (ref 0.0–0.2)
Basos: 1 %
EOS (ABSOLUTE): 0.3 10*3/uL (ref 0.0–0.4)
EOS: 4 %
HEMATOCRIT: 48.6 % — AB (ref 34.0–46.6)
Hemoglobin: 15.7 g/dL (ref 11.1–15.9)
Immature Grans (Abs): 0 10*3/uL (ref 0.0–0.1)
Immature Granulocytes: 1 %
Lymphocytes Absolute: 1.2 10*3/uL (ref 0.7–3.1)
Lymphs: 17 %
MCH: 31.3 pg (ref 26.6–33.0)
MCHC: 32.3 g/dL (ref 31.5–35.7)
MCV: 97 fL (ref 79–97)
Monocytes Absolute: 0.6 10*3/uL (ref 0.1–0.9)
Monocytes: 9 %
Neutrophils Absolute: 5.1 10*3/uL (ref 1.4–7.0)
Neutrophils: 68 %
Platelets: 224 10*3/uL (ref 150–450)
RBC: 5.01 x10E6/uL (ref 3.77–5.28)
RDW: 13.4 % (ref 11.7–15.4)
WBC: 7.4 10*3/uL (ref 3.4–10.8)

## 2018-06-16 LAB — TSH: TSH: 4.82 u[IU]/mL — ABNORMAL HIGH (ref 0.450–4.500)

## 2018-06-16 LAB — VITAMIN D 25 HYDROXY (VIT D DEFICIENCY, FRACTURES): Vit D, 25-Hydroxy: 19.2 ng/mL — ABNORMAL LOW (ref 30.0–100.0)

## 2018-06-16 NOTE — Telephone Encounter (Signed)
Spoke to patient.  She is aware of her lab results and agreeable to start the recommended vitamin D3 supplement.

## 2018-06-16 NOTE — Telephone Encounter (Signed)
Please call patient, lab evaluation showed no significant abnormalities on CMP, CBC,  Evidence of low Vit D 19, she will benefit Vit D3 supplement, 1000 units daily  Mildly elevated TSH 4.8, may repeat lab with her PCP at next yearly check up

## 2018-09-27 ENCOUNTER — Other Ambulatory Visit: Payer: Self-pay | Admitting: Neurology

## 2019-01-19 DIAGNOSIS — R635 Abnormal weight gain: Secondary | ICD-10-CM | POA: Diagnosis not present

## 2019-01-19 DIAGNOSIS — E669 Obesity, unspecified: Secondary | ICD-10-CM | POA: Diagnosis not present

## 2019-01-19 DIAGNOSIS — R5383 Other fatigue: Secondary | ICD-10-CM | POA: Diagnosis not present

## 2019-01-19 DIAGNOSIS — R0602 Shortness of breath: Secondary | ICD-10-CM | POA: Diagnosis not present

## 2019-02-19 DIAGNOSIS — Z7189 Other specified counseling: Secondary | ICD-10-CM | POA: Diagnosis not present

## 2019-02-19 DIAGNOSIS — R635 Abnormal weight gain: Secondary | ICD-10-CM | POA: Diagnosis not present

## 2019-02-19 DIAGNOSIS — E669 Obesity, unspecified: Secondary | ICD-10-CM | POA: Diagnosis not present

## 2019-03-22 DIAGNOSIS — E559 Vitamin D deficiency, unspecified: Secondary | ICD-10-CM | POA: Diagnosis not present

## 2019-03-22 DIAGNOSIS — E669 Obesity, unspecified: Secondary | ICD-10-CM | POA: Diagnosis not present

## 2019-04-20 DIAGNOSIS — E559 Vitamin D deficiency, unspecified: Secondary | ICD-10-CM | POA: Diagnosis not present

## 2019-04-20 DIAGNOSIS — E669 Obesity, unspecified: Secondary | ICD-10-CM | POA: Diagnosis not present

## 2019-06-18 ENCOUNTER — Encounter: Payer: Self-pay | Admitting: Neurology

## 2019-06-18 ENCOUNTER — Ambulatory Visit: Payer: BC Managed Care – PPO | Admitting: Neurology

## 2019-06-18 ENCOUNTER — Other Ambulatory Visit: Payer: Self-pay

## 2019-06-18 VITALS — BP 126/86 | HR 78 | Temp 96.9°F | Ht 66.0 in | Wt 242.2 lb

## 2019-06-18 DIAGNOSIS — G35 Multiple sclerosis: Secondary | ICD-10-CM | POA: Diagnosis not present

## 2019-06-18 MED ORDER — TECFIDERA 240 MG PO CPDR: DELAYED_RELEASE_CAPSULE | ORAL | 3 refills | Status: DC
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
## 2019-06-18 NOTE — Patient Instructions (Signed)
Continue taking Tecfidera, I will check routine lab work today, see you back in 1 year

## 2019-06-21 ENCOUNTER — Telehealth: Payer: Self-pay

## 2019-06-21 LAB — COMPREHENSIVE METABOLIC PANEL
ALT: 21 IU/L (ref 0–32)
AST: 20 IU/L (ref 0–40)
Albumin/Globulin Ratio: 1.7 (ref 1.2–2.2)
Albumin: 4 g/dL (ref 3.8–4.8)
Alkaline Phosphatase: 70 IU/L (ref 39–117)
BUN/Creatinine Ratio: 15 (ref 9–23)
BUN: 9 mg/dL (ref 6–24)
Bilirubin Total: 0.6 mg/dL (ref 0.0–1.2)
CO2: 22 mmol/L (ref 20–29)
Calcium: 8.8 mg/dL (ref 8.7–10.2)
Chloride: 103 mmol/L (ref 96–106)
Creatinine, Ser: 0.6 mg/dL (ref 0.57–1.00)
GFR calc Af Amer: 126 mL/min/{1.73_m2} (ref >59)
GFR calc non Af Amer: 109 mL/min/{1.73_m2} (ref >59)
Globulin, Total: 2.3 g/dL (ref 1.5–4.5)
Glucose: 93 mg/dL (ref 65–99)
Potassium: 4.5 mmol/L (ref 3.5–5.2)
Sodium: 139 mmol/L (ref 134–144)
Total Protein: 6.3 g/dL (ref 6.0–8.5)

## 2019-06-21 LAB — CBC WITH DIFFERENTIAL/PLATELET
Basophils Absolute: 0.1 10*3/uL (ref 0.0–0.2)
Basos: 1 %
EOS (ABSOLUTE): 0.2 10*3/uL (ref 0.0–0.4)
Eos: 2 %
Hematocrit: 43.6 % (ref 34.0–46.6)
Hemoglobin: 15.2 g/dL (ref 11.1–15.9)
Immature Grans (Abs): 0.1 10*3/uL (ref 0.0–0.1)
Immature Granulocytes: 1 %
Lymphocytes Absolute: 1.2 10*3/uL (ref 0.7–3.1)
Lymphs: 12 %
MCH: 32.1 pg (ref 26.6–33.0)
MCHC: 34.9 g/dL (ref 31.5–35.7)
MCV: 92 fL (ref 79–97)
Monocytes Absolute: 0.9 10*3/uL (ref 0.1–0.9)
Monocytes: 9 %
Neutrophils Absolute: 7.7 10*3/uL — ABNORMAL HIGH (ref 1.4–7.0)
Neutrophils: 75 %
Platelets: 233 10*3/uL (ref 150–450)
RBC: 4.74 x10E6/uL (ref 3.77–5.28)
RDW: 12.1 % (ref 11.7–15.4)
WBC: 10 10*3/uL (ref 3.4–10.8)

## 2019-06-21 LAB — TSH: TSH: 2.8 u[IU]/mL (ref 0.450–4.500)

## 2019-06-21 NOTE — Telephone Encounter (Signed)
Called to go over recent lab results per NP Lyndle Herrlich request. I was able to speak directly with the pt.  Pt demonstrated understanding and had no questions.  "Please call the patient. Lab work looks good. TSH is normal."-NP SS

## 2019-06-29 DIAGNOSIS — G35 Multiple sclerosis: Secondary | ICD-10-CM | POA: Diagnosis not present

## 2019-06-29 DIAGNOSIS — R109 Unspecified abdominal pain: Secondary | ICD-10-CM | POA: Diagnosis not present

## 2019-06-29 DIAGNOSIS — F17210 Nicotine dependence, cigarettes, uncomplicated: Secondary | ICD-10-CM | POA: Diagnosis not present

## 2019-07-11 NOTE — Progress Notes (Signed)
I have reviewed and agreed above plan. 

## 2019-08-14 ENCOUNTER — Telehealth: Payer: Self-pay | Admitting: Neurology

## 2019-08-14 DIAGNOSIS — G35 Multiple sclerosis: Secondary | ICD-10-CM

## 2019-08-14 NOTE — Telephone Encounter (Signed)
1) Medication(s) Requested (by name): TECFIDERA 240 MG CPDR   2) Pharmacy of Choice: [facility] [place], [place] - [address] [facility]  [address] [place], [place] [address]     Pt would like to get refills for generic version of medication for a lower copay and if it is okay for her to switch

## 2019-08-15 MED ORDER — DIMETHYL FUMARATE 240 MG PO CPDR: 240.0000 mg | DELAYED_RELEASE_CAPSULE | Freq: Two times a day (BID) | ORAL | 11 refills | Status: DC

## 2019-10-10 ENCOUNTER — Other Ambulatory Visit: Payer: Self-pay | Admitting: Family Medicine

## 2019-10-10 ENCOUNTER — Other Ambulatory Visit (HOSPITAL_COMMUNITY)
Admission: RE | Admit: 2019-10-10 | Discharge: 2019-10-10 | Disposition: A | Discharge status: Home / Self Care | Payer: BC Managed Care – PPO | Source: Ambulatory Visit | Attending: Family Medicine | Admitting: Family Medicine

## 2019-10-10 DIAGNOSIS — Z0000 Encounter for general adult medical examination without abnormal findings: Secondary | ICD-10-CM | POA: Diagnosis not present

## 2019-10-12 LAB — CYTOLOGY - PAP
Adequacy: ABSENT
Comment: NEGATIVE
Diagnosis: NEGATIVE
Diagnosis: REACTIVE
High risk HPV: NEGATIVE

## 2019-11-08 DIAGNOSIS — Z03818 Encounter for observation for suspected exposure to other biological agents ruled out: Secondary | ICD-10-CM | POA: Diagnosis not present

## 2019-11-08 DIAGNOSIS — Z20822 Contact with and (suspected) exposure to covid-19: Secondary | ICD-10-CM | POA: Diagnosis not present

## 2020-05-14 ENCOUNTER — Other Ambulatory Visit: Payer: Self-pay | Admitting: *Deleted

## 2020-05-14 DIAGNOSIS — G35 Multiple sclerosis: Secondary | ICD-10-CM

## 2020-05-14 MED ORDER — DIMETHYL FUMARATE 240 MG PO CPDR: 240.0000 mg | DELAYED_RELEASE_CAPSULE | Freq: Two times a day (BID) | ORAL | 11 refills | Status: DC

## 2020-06-18 ENCOUNTER — Ambulatory Visit: Payer: BC Managed Care – PPO | Admitting: Neurology

## 2020-06-18 ENCOUNTER — Other Ambulatory Visit: Payer: Self-pay

## 2020-06-18 ENCOUNTER — Encounter: Payer: Self-pay | Admitting: Neurology

## 2020-06-18 VITALS — BP 122/73 | HR 71 | Ht 66.0 in | Wt 243.0 lb

## 2020-06-18 DIAGNOSIS — G35 Multiple sclerosis: Secondary | ICD-10-CM | POA: Diagnosis not present
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
## 2020-06-18 NOTE — Patient Instructions (Addendum)
Check labs  Check MRI  Continue the Tecfidera  See you back in 1 year

## 2020-06-19 ENCOUNTER — Telehealth: Payer: Self-pay | Admitting: Neurology

## 2020-06-19 LAB — COMPREHENSIVE METABOLIC PANEL
ALT: 25 IU/L (ref 0–32)
AST: 26 IU/L (ref 0–40)
Albumin/Globulin Ratio: 2.3 — ABNORMAL HIGH (ref 1.2–2.2)
Albumin: 4.5 g/dL (ref 3.8–4.8)
Alkaline Phosphatase: 70 IU/L (ref 44–121)
BUN/Creatinine Ratio: 21 (ref 9–23)
BUN: 14 mg/dL (ref 6–24)
Bilirubin Total: 0.6 mg/dL (ref 0.0–1.2)
CO2: 24 mmol/L (ref 20–29)
Calcium: 9.3 mg/dL (ref 8.7–10.2)
Chloride: 102 mmol/L (ref 96–106)
Creatinine, Ser: 0.67 mg/dL (ref 0.57–1.00)
GFR calc Af Amer: 120 mL/min/{1.73_m2} (ref >59)
GFR calc non Af Amer: 104 mL/min/{1.73_m2} (ref >59)
Globulin, Total: 2 g/dL (ref 1.5–4.5)
Glucose: 90 mg/dL (ref 65–99)
Potassium: 4.6 mmol/L (ref 3.5–5.2)
Sodium: 141 mmol/L (ref 134–144)
Total Protein: 6.5 g/dL (ref 6.0–8.5)

## 2020-06-19 LAB — CBC WITH DIFFERENTIAL/PLATELET
Basophils Absolute: 0.1 10*3/uL (ref 0.0–0.2)
Basos: 1 %
EOS (ABSOLUTE): 0.3 10*3/uL (ref 0.0–0.4)
Eos: 5 %
Hematocrit: 45.8 % (ref 34.0–46.6)
Hemoglobin: 16 g/dL — ABNORMAL HIGH (ref 11.1–15.9)
Immature Grans (Abs): 0 10*3/uL (ref 0.0–0.1)
Immature Granulocytes: 0 %
Lymphocytes Absolute: 1 10*3/uL (ref 0.7–3.1)
Lymphs: 19 %
MCH: 32.9 pg (ref 26.6–33.0)
MCHC: 34.9 g/dL (ref 31.5–35.7)
MCV: 94 fL (ref 79–97)
Monocytes Absolute: 0.6 10*3/uL (ref 0.1–0.9)
Monocytes: 11 %
Neutrophils Absolute: 3.3 10*3/uL (ref 1.4–7.0)
Neutrophils: 64 %
Platelets: 263 10*3/uL (ref 150–450)
RBC: 4.86 x10E6/uL (ref 3.77–5.28)
RDW: 12.3 % (ref 11.7–15.4)
WBC: 5.2 10*3/uL (ref 3.4–10.8)

## 2020-06-19 LAB — VITAMIN D 25 HYDROXY (VIT D DEFICIENCY, FRACTURES): Vit D, 25-Hydroxy: 18.3 ng/mL — ABNORMAL LOW (ref 30.0–100.0)

## 2020-06-19 NOTE — Telephone Encounter (Signed)
[employer] [name]: [phone] (exp. 06/19/20 to 07/18/20) order sent to GI. They will reach out to the patient to schedule.

## 2020-06-23 ENCOUNTER — Telehealth: Payer: Self-pay

## 2020-06-23 NOTE — Telephone Encounter (Signed)
Pt verified by name and DOB,  normal results given per provider, pt voiced understanding all question answered. °

## 2020-06-23 NOTE — Telephone Encounter (Signed)
-----   Message from Glean Salvo, NP sent at 06/19/2020  9:14 AM EST ----- Labs are unremarkable except vitamin D deficiency, 18.3. recommend OTC supplement 1000 IU daily. Recommend have PCP recheck, she is overdo for physical.

## 2020-06-28 ENCOUNTER — Other Ambulatory Visit: Payer: Self-pay

## 2020-06-28 ENCOUNTER — Ambulatory Visit
Admission: RE | Admit: 2020-06-28 | Discharge: 2020-06-28 | Disposition: A | Discharge status: Home / Self Care | Payer: No Typology Code available for payment source | Source: Ambulatory Visit | Attending: Neurology | Admitting: Neurology

## 2020-06-28 DIAGNOSIS — G35 Multiple sclerosis: Secondary | ICD-10-CM

## 2020-06-28 MED ORDER — GADOBENATE DIMEGLUMINE 529 MG/ML IV SOLN
20.0000 mL | Freq: Once | INTRAVENOUS | Status: AC | PRN
  Administered 2020-06-28: 20 mL via INTRAVENOUS

## 2020-07-01 ENCOUNTER — Telehealth: Payer: Self-pay

## 2020-07-01 NOTE — Telephone Encounter (Signed)
-----   Message from Glean Salvo, NP sent at 06/30/2020 11:24 AM EST ----- Sent my chart message:

## 2020-07-04 DIAGNOSIS — E7800 Pure hypercholesterolemia, unspecified: Secondary | ICD-10-CM | POA: Diagnosis not present

## 2020-07-04 DIAGNOSIS — D539 Nutritional anemia, unspecified: Secondary | ICD-10-CM | POA: Diagnosis not present

## 2020-07-04 DIAGNOSIS — E349 Endocrine disorder, unspecified: Secondary | ICD-10-CM | POA: Diagnosis not present

## 2020-07-04 DIAGNOSIS — Z79899 Other long term (current) drug therapy: Secondary | ICD-10-CM | POA: Diagnosis not present

## 2020-07-04 DIAGNOSIS — E669 Obesity, unspecified: Secondary | ICD-10-CM | POA: Diagnosis not present

## 2020-07-04 DIAGNOSIS — E559 Vitamin D deficiency, unspecified: Secondary | ICD-10-CM | POA: Diagnosis not present

## 2020-07-04 DIAGNOSIS — R635 Abnormal weight gain: Secondary | ICD-10-CM | POA: Diagnosis not present

## 2020-07-04 DIAGNOSIS — E8881 Metabolic syndrome: Secondary | ICD-10-CM | POA: Diagnosis not present

## 2020-07-04 DIAGNOSIS — R0602 Shortness of breath: Secondary | ICD-10-CM | POA: Diagnosis not present

## 2020-07-04 DIAGNOSIS — R5383 Other fatigue: Secondary | ICD-10-CM | POA: Diagnosis not present

## 2020-07-04 DIAGNOSIS — Z20822 Contact with and (suspected) exposure to covid-19: Secondary | ICD-10-CM | POA: Diagnosis not present

## 2020-07-04 DIAGNOSIS — Z131 Encounter for screening for diabetes mellitus: Secondary | ICD-10-CM | POA: Diagnosis not present

## 2020-08-01 DIAGNOSIS — E8881 Metabolic syndrome: Secondary | ICD-10-CM | POA: Diagnosis not present

## 2020-08-01 DIAGNOSIS — R635 Abnormal weight gain: Secondary | ICD-10-CM | POA: Diagnosis not present

## 2020-09-02 DIAGNOSIS — Z6834 Body mass index (BMI) 34.0-34.9, adult: Secondary | ICD-10-CM | POA: Diagnosis not present

## 2020-09-02 DIAGNOSIS — R635 Abnormal weight gain: Secondary | ICD-10-CM | POA: Diagnosis not present

## 2020-09-02 DIAGNOSIS — Z3200 Encounter for pregnancy test, result unknown: Secondary | ICD-10-CM | POA: Diagnosis not present

## 2020-10-02 DIAGNOSIS — Z3200 Encounter for pregnancy test, result unknown: Secondary | ICD-10-CM | POA: Diagnosis not present

## 2020-10-02 DIAGNOSIS — R030 Elevated blood-pressure reading, without diagnosis of hypertension: Secondary | ICD-10-CM | POA: Diagnosis not present

## 2020-10-02 DIAGNOSIS — R635 Abnormal weight gain: Secondary | ICD-10-CM | POA: Diagnosis not present

## 2020-10-21 DIAGNOSIS — Z23 Encounter for immunization: Secondary | ICD-10-CM | POA: Diagnosis not present

## 2020-10-21 DIAGNOSIS — Z0000 Encounter for general adult medical examination without abnormal findings: Secondary | ICD-10-CM | POA: Diagnosis not present

## 2020-11-03 DIAGNOSIS — Z20822 Contact with and (suspected) exposure to covid-19: Secondary | ICD-10-CM | POA: Diagnosis not present

## 2020-11-03 DIAGNOSIS — Z03818 Encounter for observation for suspected exposure to other biological agents ruled out: Secondary | ICD-10-CM | POA: Diagnosis not present

## 2020-11-18 ENCOUNTER — Ambulatory Visit (INDEPENDENT_AMBULATORY_CARE_PROVIDER_SITE_OTHER): Payer: BC Managed Care – PPO | Admitting: Obstetrics and Gynecology

## 2020-11-18 ENCOUNTER — Other Ambulatory Visit: Payer: Self-pay

## 2020-11-18 ENCOUNTER — Encounter: Payer: Self-pay | Admitting: Obstetrics and Gynecology

## 2020-11-18 VITALS — BP 128/86 | HR 76 | Ht 66.0 in | Wt 212.0 lb

## 2020-11-18 DIAGNOSIS — Z30017 Encounter for initial prescription of implantable subdermal contraceptive: Secondary | ICD-10-CM

## 2020-11-18 DIAGNOSIS — Z1231 Encounter for screening mammogram for malignant neoplasm of breast: Secondary | ICD-10-CM

## 2020-11-18 DIAGNOSIS — Z30432 Encounter for removal of intrauterine contraceptive device: Secondary | ICD-10-CM

## 2020-11-18 MED ORDER — ETONOGESTREL 68 MG ~~LOC~~ IMPL
68.0000 mg | DRUG_IMPLANT | Freq: Once | SUBCUTANEOUS | Status: AC
  Administered 2020-11-18: 68 mg via SUBCUTANEOUS

## 2020-11-18 NOTE — Progress Notes (Signed)
50 yo P2 here for IUD removal. Patient with a normal pap smear 10/2019. She is overdue for a mammogram which has been ordered. Patient plans to complete Cologard this week. Patient desires Nexplanon for contraception. She was made aware of irregular vaginal bleeding that can occur for the first 12 months following Nexplanon insertion  GYNECOLOGY CLINIC PROCEDURE NOTE  IUD Removal  Patient identified, informed consent performed, consent signed.  Patient was in the dorsal lithotomy position, normal external genitalia was noted.  A speculum was placed in the patient's vagina, normal discharge was noted, no lesions. The cervix was visualized, no lesions, no abnormal discharge.  The strings of the IUD were grasped and pulled using ring forceps. The IUD was removed in its entirety. Patient tolerated the procedure well.    Nexplanon insertion Appropriate time out taken.  Patient's left arm was prepped and draped in the usual sterile fashion.. The ruler used to measure and mark insertion area.  Patient was prepped with alcohol swab and then injected with 3 cc of 1% lidocaine with epinephrine.  Patient was prepped with betadine, Nexplanon removed form packaging.  Device confirmed in needle, then inserted full length of needle and withdrawn per handbook instructions.  Patient insertion site covered with a band aid and Coband.   Minimal blood loss.  Patient tolerated the procedure well.

## 2020-11-18 NOTE — Progress Notes (Signed)
Here for IUD removal and new BC.  Reports cycles are erratic. Sts has a little bit of bleeding monthly.  PAP 10/2019 normal, distant history of 1 abnormal Mammogram 2017 Has Cologard test at home to take and return to primary MD.

## 2020-12-02 ENCOUNTER — Telehealth: Payer: Self-pay | Admitting: *Deleted

## 2020-12-02 NOTE — Telephone Encounter (Signed)
PA for dimethyl fumarate completed over the over the phone. [phone]. Pt has coverage through [facility] (ph: [phone]). It meets criteria and can now be processed at her specialty pharmacy.  Per the rep [facility]), the PA did not generate an expiration date at the time of the call. They will update the specialty pharmacy with this information, once it is available. Per Epic, the patient is using [facility].

## 2020-12-12 DIAGNOSIS — Z1211 Encounter for screening for malignant neoplasm of colon: Secondary | ICD-10-CM | POA: Diagnosis not present

## 2021-02-05 ENCOUNTER — Ambulatory Visit
Admission: RE | Admit: 2021-02-05 | Discharge: 2021-02-05 | Disposition: A | Discharge status: Home / Self Care | Payer: BC Managed Care – PPO | Source: Ambulatory Visit | Attending: Obstetrics and Gynecology | Admitting: Obstetrics and Gynecology

## 2021-02-05 ENCOUNTER — Other Ambulatory Visit: Payer: Self-pay

## 2021-02-05 DIAGNOSIS — Z1231 Encounter for screening mammogram for malignant neoplasm of breast: Secondary | ICD-10-CM

## 2021-04-06 ENCOUNTER — Other Ambulatory Visit: Payer: Self-pay | Admitting: Neurology

## 2021-04-06 DIAGNOSIS — G35 Multiple sclerosis: Secondary | ICD-10-CM
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
## 2021-06-18 ENCOUNTER — Ambulatory Visit: Payer: BC Managed Care – PPO | Admitting: Neurology

## 2021-06-18 ENCOUNTER — Other Ambulatory Visit: Payer: Self-pay

## 2021-06-18 ENCOUNTER — Encounter: Payer: Self-pay | Admitting: Neurology

## 2021-06-18 VITALS — BP 122/81 | HR 85 | Ht 66.0 in | Wt 223.2 lb

## 2021-06-18 DIAGNOSIS — G35 Multiple sclerosis: Secondary | ICD-10-CM | POA: Diagnosis not present

## 2021-06-18 MED ORDER — DIMETHYL FUMARATE 240 MG PO CPDR: 1.0000 | DELAYED_RELEASE_CAPSULE | Freq: Two times a day (BID) | ORAL | 11 refills | Status: DC

## 2021-06-19 LAB — COMPREHENSIVE METABOLIC PANEL
ALT: 16 IU/L (ref 0–32)
AST: 15 IU/L (ref 0–40)
Albumin/Globulin Ratio: 2.1 (ref 1.2–2.2)
Albumin: 4.5 g/dL (ref 3.8–4.8)
Alkaline Phosphatase: 58 IU/L (ref 44–121)
BUN/Creatinine Ratio: 20 (ref 9–23)
BUN: 12 mg/dL (ref 6–24)
Bilirubin Total: 0.7 mg/dL (ref 0.0–1.2)
CO2: 25 mmol/L (ref 20–29)
Calcium: 9.1 mg/dL (ref 8.7–10.2)
Chloride: 104 mmol/L (ref 96–106)
Creatinine, Ser: 0.6 mg/dL (ref 0.57–1.00)
Globulin, Total: 2.1 g/dL (ref 1.5–4.5)
Glucose: 89 mg/dL (ref 70–99)
Potassium: 4.5 mmol/L (ref 3.5–5.2)
Sodium: 141 mmol/L (ref 134–144)
Total Protein: 6.6 g/dL (ref 6.0–8.5)
eGFR: 110 mL/min/{1.73_m2} (ref >59)

## 2021-06-19 LAB — CBC WITH DIFFERENTIAL/PLATELET
Basophils Absolute: 0.1 10*3/uL (ref 0.0–0.2)
Basos: 1 %
EOS (ABSOLUTE): 0.2 10*3/uL (ref 0.0–0.4)
Eos: 3 %
Hematocrit: 47.5 % — ABNORMAL HIGH (ref 34.0–46.6)
Hemoglobin: 15.9 g/dL (ref 11.1–15.9)
Immature Grans (Abs): 0 10*3/uL (ref 0.0–0.1)
Immature Granulocytes: 0 %
Lymphocytes Absolute: 1.1 10*3/uL (ref 0.7–3.1)
Lymphs: 19 %
MCH: 32.3 pg (ref 26.6–33.0)
MCHC: 33.5 g/dL (ref 31.5–35.7)
MCV: 97 fL (ref 79–97)
Monocytes Absolute: 0.5 10*3/uL (ref 0.1–0.9)
Monocytes: 10 %
Neutrophils Absolute: 3.8 10*3/uL (ref 1.4–7.0)
Neutrophils: 67 %
Platelets: 220 10*3/uL (ref 150–450)
RBC: 4.92 x10E6/uL (ref 3.77–5.28)
RDW: 12 % (ref 11.7–15.4)
WBC: 5.7 10*3/uL (ref 3.4–10.8)

## 2021-06-19 LAB — VITAMIN D 25 HYDROXY (VIT D DEFICIENCY, FRACTURES): Vit D, 25-Hydroxy: 20.4 ng/mL — ABNORMAL LOW (ref 30.0–100.0)

## 2021-11-17 ENCOUNTER — Telehealth: Payer: Self-pay | Admitting: *Deleted

## 2021-11-17 NOTE — Telephone Encounter (Signed)
Received PA request form by fax for Dimethyl Fumarate (from [facility] - ph:[phone])). The form indicated that this medication was preferred. The attached questions were pertaining to non-formulary medications. I faxed it back to them at [phone]) with a note that the patient has MS (ICD10: G35) and is currently taking the preferred drug. It did not appear that a PA was needed.

## 2021-11-23 ENCOUNTER — Telehealth: Payer: Self-pay | Admitting: *Deleted

## 2021-11-23 NOTE — Telephone Encounter (Signed)
Unable to submit PA for dimethyl fumarate on covermymeds (technical issues). Completed over the phone with [facility] (specialty PA dept#[phone]). SJ#[phone]. Decision pending.

## 2021-11-24 NOTE — Telephone Encounter (Signed)
PA approved through 11/24/2022. 

## 2022-02-03 ENCOUNTER — Telehealth: Payer: Self-pay | Admitting: Neurology

## 2022-02-03 NOTE — Telephone Encounter (Signed)
Pt is asking for a call to discuss the notification she got from insurance that Dimethyl Fumarate 240 MG CPDR, is not covered thru her plan

## 2022-02-03 NOTE — Telephone Encounter (Signed)
I spoke with the pharmacy. The patient's medication requires a new PA. It was submitted and is waiting for review. Key #: [id number].

## 2022-02-03 NOTE — Telephone Encounter (Signed)
This request has received a Cancelled outcome.  This may mean either your patient does not have active coverage with this plan, this authorization was processed as a duplicate request, or an authorization was not needed for this medication.  I spoke with the patient and informed her to call her insurance company regarding her coverage. She verbalized understanding and expressed appreciation for the call.

## 2022-02-19 NOTE — Telephone Encounter (Signed)
Pt said spoke with insurance company, they said need approval from neurologist for Dimethyl Fumarate 240 MG CPDR Would like a call from the nurse

## 2022-02-22 NOTE — Telephone Encounter (Addendum)
I called [facility] spoke with Able who confirmed PA is still needed for this med.   I called [facility] # [phone], spoke with Benjamine Mola who verified PA is needed and I submitted verbally with her.  Case # [id number], determination should be reached within 24-72 business hours. We will be notified via fax.  I have updated the pt on this as well.

## 2022-02-23 NOTE — Telephone Encounter (Signed)
Received PA approval from [facility]..   Approval effective from 02/22/2022-02/23/2023.   I attempted to update the pt on this she was unavailable and vm not set up.  If pt calls back please update.

## 2022-06-08 IMAGING — MG MM DIGITAL SCREENING BILAT W/ TOMO AND CAD
8 series · 8 of 24 positions shown · non-contrast
Comparison: Previous exam(s).

CLINICAL DATA: Screening.

EXAM:
DIGITAL SCREENING BILATERAL MAMMOGRAM WITH TOMOSYNTHESIS AND CAD
TECHNIQUE: Bilateral screening digital craniocaudal and mediolateral oblique
mammograms were obtained. Bilateral screening digital breast
tomosynthesis was performed. The images were evaluated with
computer-aided detection.

[L CC synth-2D]
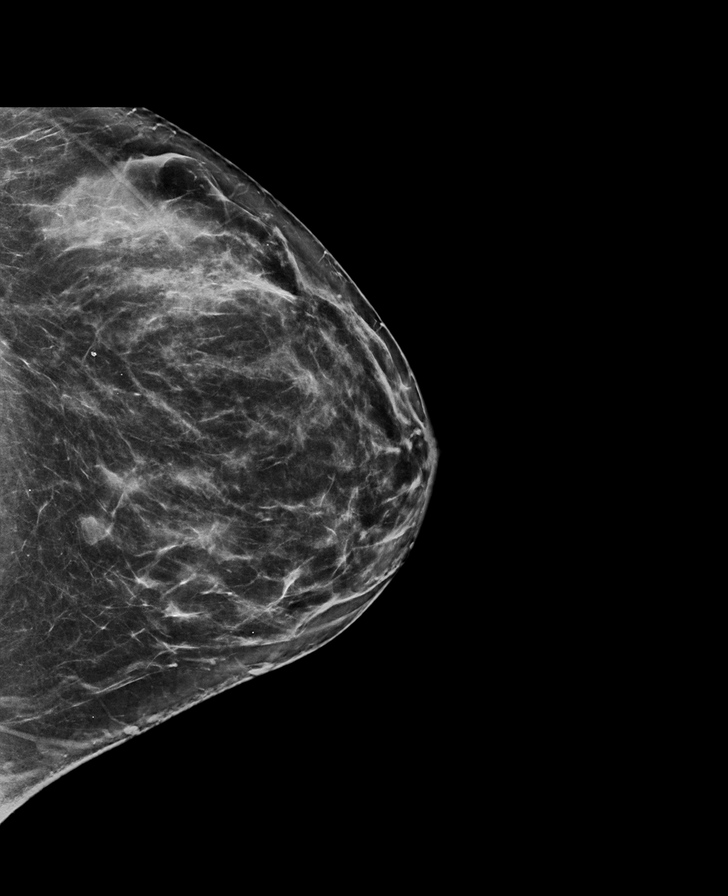

[R MLO synth-2D]
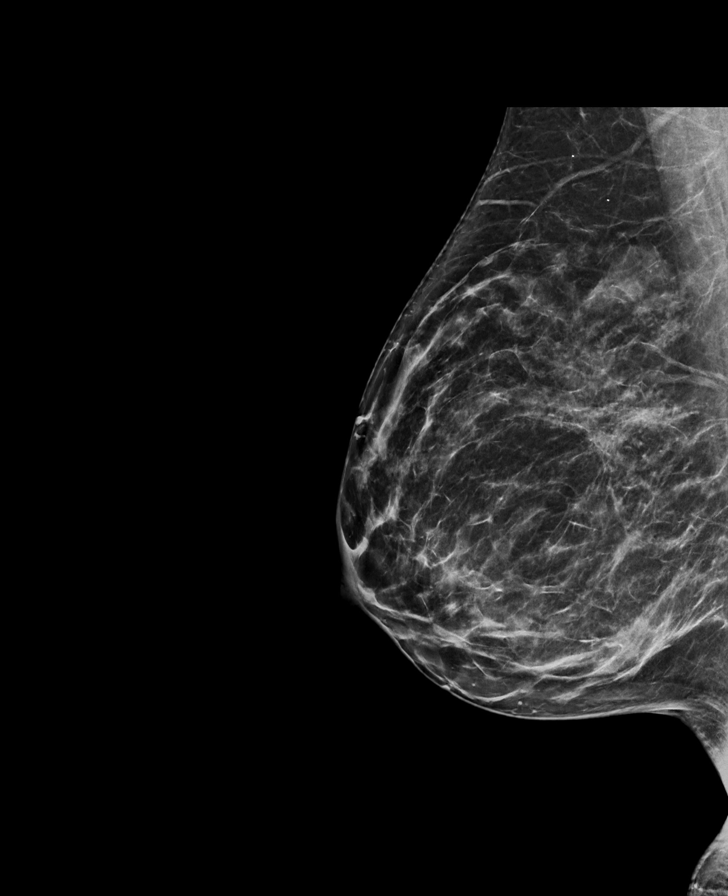

[R CC synth-2D]
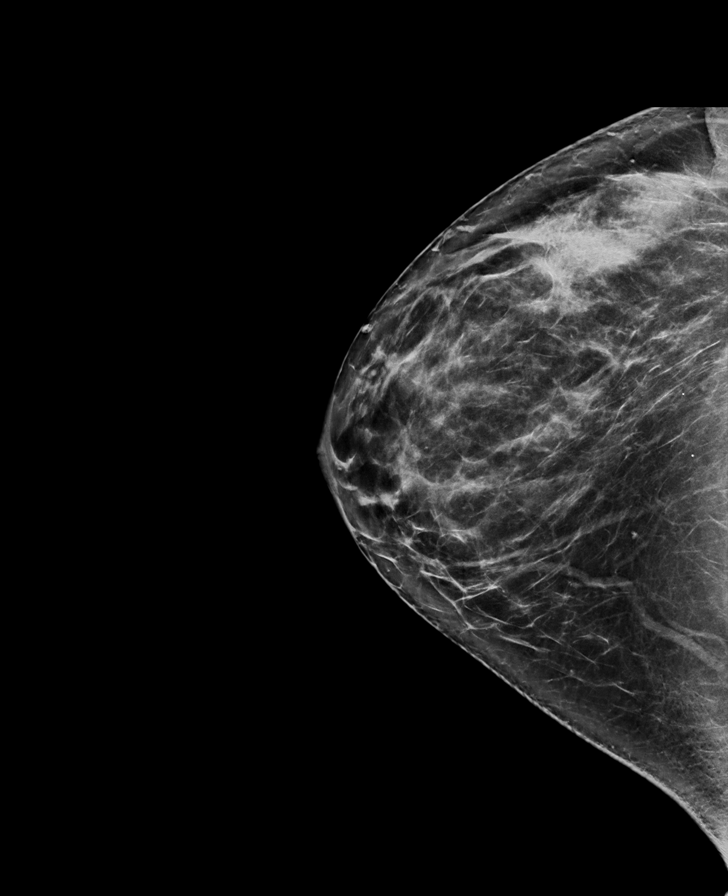

[L MLO synth-2D]
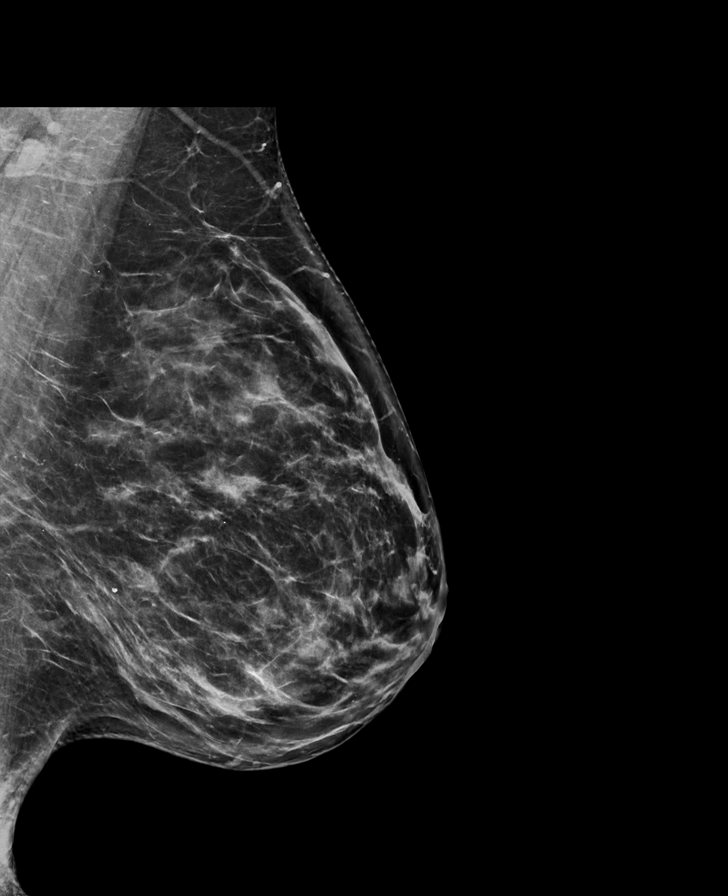

[R MLO tomo · tomo slice 39/78.0]
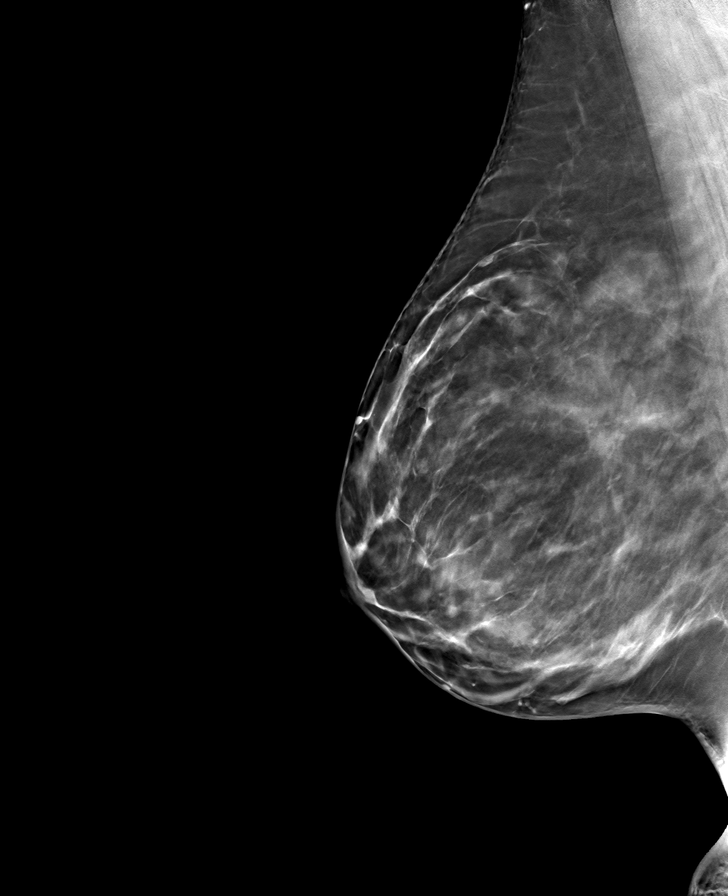

[R CC tomo · tomo slice 44/87.0]
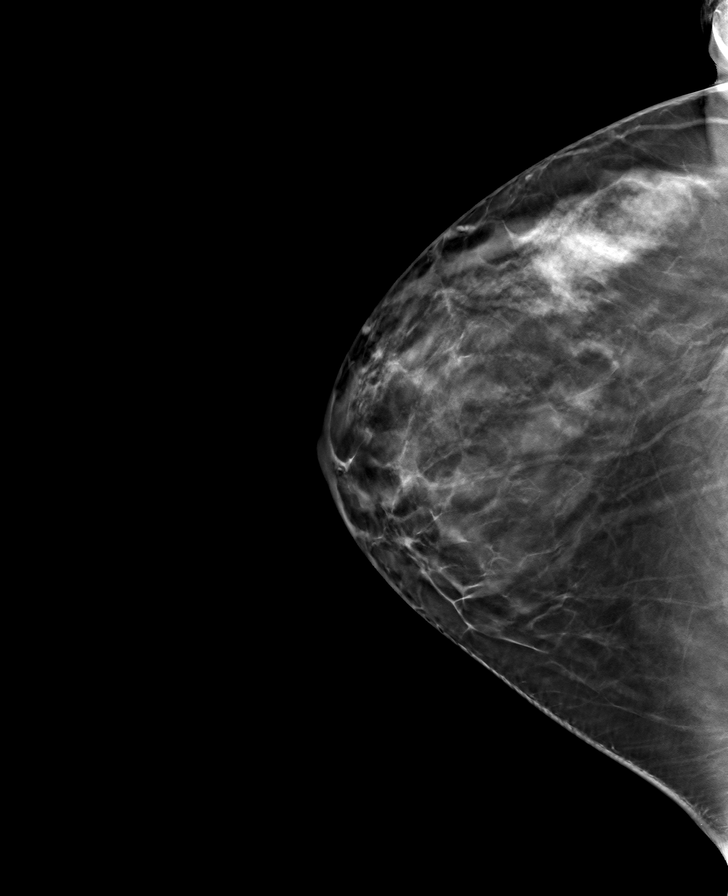

[L MLO tomo · tomo slice 43/85.0]
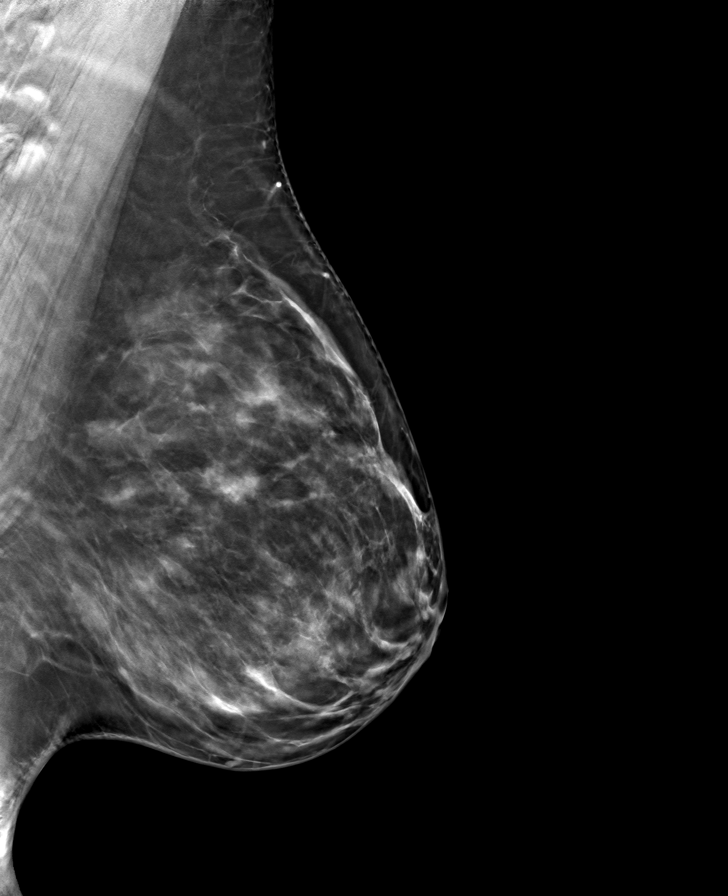

[L CC tomo · tomo slice 41/82.0]
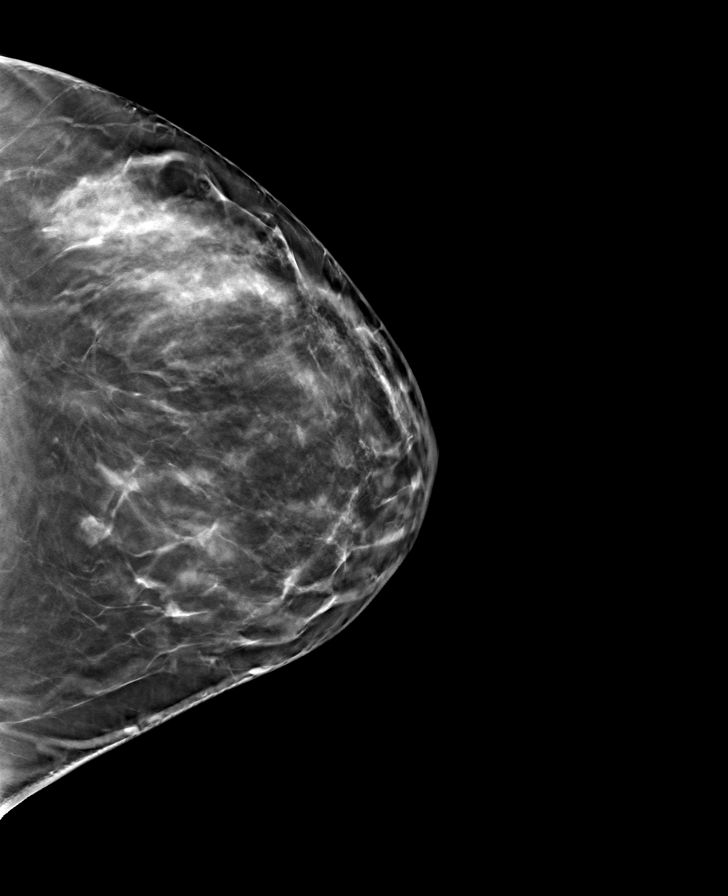

[8 of 24 positions shown; findings below may reference images not displayed]

ACR Breast Density Category c: The breast tissue is heterogeneously
dense, which may obscure small masses.
FINDINGS: There are no findings suspicious for malignancy.
IMPRESSION: No mammographic evidence of malignancy. A result letter of this
screening mammogram will be mailed directly to the patient.

RECOMMENDATION:
Screening mammogram in one year. (Code:Q3-W-BC3)

BI-RADS CATEGORY  1: Negative.

## 2022-06-22 ENCOUNTER — Ambulatory Visit: Payer: BC Managed Care – PPO | Admitting: Neurology

## 2022-06-22 ENCOUNTER — Encounter: Payer: Self-pay | Admitting: Neurology

## 2022-06-22 VITALS — BP 135/82 | HR 82 | Ht 66.0 in | Wt 223.0 lb

## 2022-06-22 DIAGNOSIS — G35 Multiple sclerosis: Secondary | ICD-10-CM

## 2022-06-22 DIAGNOSIS — G35D Multiple sclerosis, unspecified: Secondary | ICD-10-CM

## 2022-06-22 MED ORDER — DIMETHYL FUMARATE 240 MG PO CPDR: 1.0000 | DELAYED_RELEASE_CAPSULE | Freq: Two times a day (BID) | ORAL | 11 refills | Status: DC
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
## 2022-06-23 LAB — COMPREHENSIVE METABOLIC PANEL
ALT: 19 IU/L (ref 0–32)
AST: 17 IU/L (ref 0–40)
Albumin/Globulin Ratio: 2 (ref 1.2–2.2)
Albumin: 4.4 g/dL (ref 3.9–4.9)
Alkaline Phosphatase: 72 IU/L (ref 44–121)
BUN/Creatinine Ratio: 24 — ABNORMAL HIGH (ref 9–23)
BUN: 16 mg/dL (ref 6–24)
Bilirubin Total: 0.3 mg/dL (ref 0.0–1.2)
CO2: 21 mmol/L (ref 20–29)
Calcium: 9.5 mg/dL (ref 8.7–10.2)
Chloride: 105 mmol/L (ref 96–106)
Creatinine, Ser: 0.67 mg/dL (ref 0.57–1.00)
Globulin, Total: 2.2 g/dL (ref 1.5–4.5)
Glucose: 92 mg/dL (ref 70–99)
Potassium: 4.3 mmol/L (ref 3.5–5.2)
Sodium: 140 mmol/L (ref 134–144)
Total Protein: 6.6 g/dL (ref 6.0–8.5)
eGFR: 106 mL/min/{1.73_m2} (ref >59)

## 2022-06-23 LAB — CBC WITH DIFFERENTIAL/PLATELET
Basophils Absolute: 0.1 10*3/uL (ref 0.0–0.2)
Basos: 1 %
EOS (ABSOLUTE): 0.3 10*3/uL (ref 0.0–0.4)
Eos: 5 %
Hematocrit: 46.7 % — ABNORMAL HIGH (ref 34.0–46.6)
Hemoglobin: 15.5 g/dL (ref 11.1–15.9)
Immature Grans (Abs): 0 10*3/uL (ref 0.0–0.1)
Immature Granulocytes: 0 %
Lymphocytes Absolute: 1.5 10*3/uL (ref 0.7–3.1)
Lymphs: 30 %
MCH: 32 pg (ref 26.6–33.0)
MCHC: 33.2 g/dL (ref 31.5–35.7)
MCV: 97 fL (ref 79–97)
Monocytes Absolute: 0.5 10*3/uL (ref 0.1–0.9)
Monocytes: 10 %
Neutrophils Absolute: 2.8 10*3/uL (ref 1.4–7.0)
Neutrophils: 54 %
Platelets: 235 10*3/uL (ref 150–450)
RBC: 4.84 x10E6/uL (ref 3.77–5.28)
RDW: 12.5 % (ref 11.7–15.4)
WBC: 5.2 10*3/uL (ref 3.4–10.8)

## 2022-06-23 LAB — VITAMIN D 25 HYDROXY (VIT D DEFICIENCY, FRACTURES): Vit D, 25-Hydroxy: 17.4 ng/mL — ABNORMAL LOW (ref 30.0–100.0)

## 2022-08-24 ENCOUNTER — Other Ambulatory Visit: Payer: Self-pay | Admitting: Family Medicine

## 2022-08-24 DIAGNOSIS — Z1231 Encounter for screening mammogram for malignant neoplasm of breast: Secondary | ICD-10-CM

## 2022-12-01 ENCOUNTER — Ambulatory Visit
Admission: RE | Admit: 2022-12-01 | Discharge: 2022-12-01 | Disposition: A | Discharge status: Home / Self Care | Payer: BC Managed Care – PPO | Source: Ambulatory Visit | Attending: Family Medicine | Admitting: Family Medicine

## 2022-12-01 DIAGNOSIS — Z1231 Encounter for screening mammogram for malignant neoplasm of breast: Secondary | ICD-10-CM

## 2023-01-12 ENCOUNTER — Telehealth: Payer: Self-pay | Admitting: Neurology

## 2023-01-12 NOTE — Telephone Encounter (Signed)
[facility]) asking if pt needs PA for Dimethyl Fumarate 240 MG CPDR. Would like a call back. Reference key:  [id number]

## 2023-01-13 ENCOUNTER — Other Ambulatory Visit (HOSPITAL_COMMUNITY): Payer: Self-pay

## 2023-01-13 ENCOUNTER — Telehealth: Payer: Self-pay

## 2023-01-13 NOTE — Telephone Encounter (Signed)
PA request has been Submitted. New Encounter created for follow up. For additional info see Pharmacy Prior Auth telephone encounter from 01/13/2023.

## 2023-01-13 NOTE — Telephone Encounter (Signed)
Pharmacy Patient Advocate Encounter   Received notification from Physician's Office that prior authorization for Dimethyl Fumarate 240MG  dr capsules is required/requested.   Insurance verification completed.   The patient is insured through [facility] .   Per test claim: PA required; PA submitted to [facility] via CoverMyMeds Key/confirmation #/EOC [id number] Status is pending

## 2023-01-14 ENCOUNTER — Other Ambulatory Visit (HOSPITAL_COMMUNITY): Payer: Self-pay

## 2023-01-14 NOTE — Telephone Encounter (Signed)
Pharmacy Patient Advocate Encounter  Received notification from [facility] that Prior Authorization for Dimethyl Fumarate 240MG  dr capsules has been APPROVED from 01/13/2023 to 01/12/2024   PA #/Case ID/Reference #: PA Case ID #: [id number]

## 2023-02-17 ENCOUNTER — Other Ambulatory Visit (HOSPITAL_BASED_OUTPATIENT_CLINIC_OR_DEPARTMENT_OTHER): Payer: Self-pay

## 2023-02-17 MED ORDER — WEGOVY 0.25 MG/0.5ML ~~LOC~~ SOAJ
0.2500 mg | SUBCUTANEOUS | 0 refills | Status: DC
  Filled 2023-02-17: qty 2, 28d supply, fill #0

## 2023-03-16 ENCOUNTER — Other Ambulatory Visit (HOSPITAL_BASED_OUTPATIENT_CLINIC_OR_DEPARTMENT_OTHER): Payer: Self-pay

## 2023-03-16 MED ORDER — WEGOVY 1 MG/0.5ML ~~LOC~~ SOAJ
1.0000 mg | SUBCUTANEOUS | 0 refills | Status: DC
  Filled 2023-03-16: qty 2, 28d supply, fill #0

## 2023-03-21 ENCOUNTER — Other Ambulatory Visit (HOSPITAL_BASED_OUTPATIENT_CLINIC_OR_DEPARTMENT_OTHER): Payer: Self-pay

## 2023-04-14 ENCOUNTER — Other Ambulatory Visit (HOSPITAL_BASED_OUTPATIENT_CLINIC_OR_DEPARTMENT_OTHER): Payer: Self-pay

## 2023-04-14 MED ORDER — WEGOVY 1 MG/0.5ML ~~LOC~~ SOAJ
1.0000 mg | SUBCUTANEOUS | 0 refills | Status: DC
  Filled 2023-04-14: qty 2, 28d supply, fill #0

## 2023-05-19 ENCOUNTER — Other Ambulatory Visit (HOSPITAL_BASED_OUTPATIENT_CLINIC_OR_DEPARTMENT_OTHER): Payer: Self-pay

## 2023-05-19 MED ORDER — BUPROPION HCL ER (SR) 150 MG PO TB12
150.0000 mg | ORAL_TABLET | Freq: Every day | ORAL | 0 refills | Status: DC
  Filled 2023-05-19 – 2023-06-09 (×2): qty 30, 30d supply, fill #0

## 2023-05-19 MED ORDER — PHENTERMINE HCL 37.5 MG PO TABS
56.2500 mg | ORAL_TABLET | Freq: Every day | ORAL | 0 refills | Status: DC
  Filled 2023-05-19: qty 45, 30d supply, fill #0

## 2023-05-19 MED ORDER — WEGOVY 1.7 MG/0.75ML ~~LOC~~ SOAJ
1.7000 mg | SUBCUTANEOUS | 0 refills | Status: DC
  Filled 2023-05-19: qty 3, 28d supply, fill #0

## 2023-05-20 ENCOUNTER — Other Ambulatory Visit (HOSPITAL_BASED_OUTPATIENT_CLINIC_OR_DEPARTMENT_OTHER): Payer: Self-pay

## 2023-05-23 ENCOUNTER — Other Ambulatory Visit (HOSPITAL_BASED_OUTPATIENT_CLINIC_OR_DEPARTMENT_OTHER): Payer: Self-pay

## 2023-05-24 ENCOUNTER — Other Ambulatory Visit (HOSPITAL_BASED_OUTPATIENT_CLINIC_OR_DEPARTMENT_OTHER): Payer: Self-pay

## 2023-06-02 ENCOUNTER — Other Ambulatory Visit: Payer: Self-pay | Admitting: Neurology

## 2023-06-02 DIAGNOSIS — G35 Multiple sclerosis: Secondary | ICD-10-CM

## 2023-06-09 ENCOUNTER — Other Ambulatory Visit: Payer: Self-pay

## 2023-06-09 ENCOUNTER — Other Ambulatory Visit (HOSPITAL_BASED_OUTPATIENT_CLINIC_OR_DEPARTMENT_OTHER): Payer: Self-pay

## 2023-06-17 ENCOUNTER — Other Ambulatory Visit (HOSPITAL_BASED_OUTPATIENT_CLINIC_OR_DEPARTMENT_OTHER): Payer: Self-pay

## 2023-06-17 MED ORDER — BUPROPION HCL ER (SR) 150 MG PO TB12
150.0000 mg | ORAL_TABLET | Freq: Every day | ORAL | 0 refills | Status: AC
  Filled 2023-07-08 – 2023-10-16 (×2): qty 30, 30d supply, fill #0
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
## 2023-06-23 ENCOUNTER — Encounter: Payer: Self-pay | Admitting: Neurology

## 2023-06-23 ENCOUNTER — Ambulatory Visit: Payer: BC Managed Care – PPO | Admitting: Neurology

## 2023-06-23 VITALS — BP 133/82 | HR 84 | Ht 66.0 in | Wt 206.0 lb

## 2023-06-23 DIAGNOSIS — G35 Multiple sclerosis: Secondary | ICD-10-CM

## 2023-06-23 DIAGNOSIS — E559 Vitamin D deficiency, unspecified: Secondary | ICD-10-CM

## 2023-06-23 NOTE — Patient Instructions (Signed)
Great to see you today.  Please continue dimethyl fumarate for MS. check labs today, order MRI of the brain to look for subclinical progression.  Call for any change in symptoms.  Follow-up in 1 year.  Thanks!!

## 2023-06-24 ENCOUNTER — Encounter: Payer: Self-pay | Admitting: Neurology

## 2023-06-24 LAB — COMPREHENSIVE METABOLIC PANEL
ALT: 17 [IU]/L (ref 0–32)
AST: 20 [IU]/L (ref 0–40)
Albumin: 4.7 g/dL (ref 3.8–4.9)
Alkaline Phosphatase: 72 [IU]/L (ref 44–121)
BUN/Creatinine Ratio: 23 (ref 9–23)
BUN: 16 mg/dL (ref 6–24)
Bilirubin Total: 0.7 mg/dL (ref 0.0–1.2)
CO2: 24 mmol/L (ref 20–29)
Calcium: 9.6 mg/dL (ref 8.7–10.2)
Chloride: 103 mmol/L (ref 96–106)
Creatinine, Ser: 0.69 mg/dL (ref 0.57–1.00)
Globulin, Total: 2.1 g/dL (ref 1.5–4.5)
Glucose: 84 mg/dL (ref 70–99)
Potassium: 4.6 mmol/L (ref 3.5–5.2)
Sodium: 140 mmol/L (ref 134–144)
Total Protein: 6.8 g/dL (ref 6.0–8.5)
eGFR: 105 mL/min/{1.73_m2} (ref >59)

## 2023-06-24 LAB — CBC WITH DIFFERENTIAL/PLATELET
Basophils Absolute: 0.1 10*3/uL (ref 0.0–0.2)
Basos: 1 %
EOS (ABSOLUTE): 0.2 10*3/uL (ref 0.0–0.4)
Eos: 3 %
Hematocrit: 48 % — ABNORMAL HIGH (ref 34.0–46.6)
Hemoglobin: 16.1 g/dL — ABNORMAL HIGH (ref 11.1–15.9)
Immature Grans (Abs): 0 10*3/uL (ref 0.0–0.1)
Immature Granulocytes: 0 %
Lymphocytes Absolute: 1.3 10*3/uL (ref 0.7–3.1)
Lymphs: 16 %
MCH: 32.3 pg (ref 26.6–33.0)
MCHC: 33.5 g/dL (ref 31.5–35.7)
MCV: 96 fL (ref 79–97)
Monocytes Absolute: 0.7 10*3/uL (ref 0.1–0.9)
Monocytes: 8 %
Neutrophils Absolute: 5.8 10*3/uL (ref 1.4–7.0)
Neutrophils: 72 %
Platelets: 238 10*3/uL (ref 150–450)
RBC: 4.99 x10E6/uL (ref 3.77–5.28)
RDW: 12.1 % (ref 11.7–15.4)
WBC: 8.1 10*3/uL (ref 3.4–10.8)

## 2023-06-24 LAB — VITAMIN D 25 HYDROXY (VIT D DEFICIENCY, FRACTURES): Vit D, 25-Hydroxy: 27 ng/mL — ABNORMAL LOW (ref 30.0–100.0)

## 2023-06-27 ENCOUNTER — Telehealth: Payer: Self-pay | Admitting: Anesthesiology

## 2023-06-27 NOTE — Telephone Encounter (Signed)
-----   Message from Glean Salvo sent at 06/24/2023  9:27 PM EST ----- Please fax labs to PCP. Thanks

## 2023-06-27 NOTE — Telephone Encounter (Signed)
Message with results sent to pt through MyChart.

## 2023-06-30 ENCOUNTER — Other Ambulatory Visit (HOSPITAL_BASED_OUTPATIENT_CLINIC_OR_DEPARTMENT_OTHER): Payer: Self-pay

## 2023-06-30 MED ORDER — BUPROPION HCL ER (SR) 150 MG PO TB12
150.0000 mg | ORAL_TABLET | Freq: Every day | ORAL | 0 refills | Status: AC
  Filled 2023-06-30: qty 30, 30d supply, fill #0

## 2023-06-30 MED ORDER — ERGOCALCIFEROL 1.25 MG (50000 UT) PO CAPS
50000.0000 [IU] | ORAL_CAPSULE | ORAL | 5 refills | Status: AC
  Filled 2023-06-30: qty 4, 28d supply, fill #0
  Filled 2023-10-16: qty 4, 28d supply, fill #1
  Filled 2023-12-29: qty 4, 28d supply, fill #2
  Filled 2024-02-24: qty 4, 28d supply, fill #3
  Filled 2024-03-23: qty 4, 28d supply, fill #4

## 2023-06-30 MED ORDER — WEGOVY 1.7 MG/0.75ML ~~LOC~~ SOAJ
1.7000 mg | SUBCUTANEOUS | 0 refills | Status: AC
Start: 2023-06-30 — End: ?
  Filled 2023-06-30: qty 3, 28d supply, fill #0

## 2023-06-30 MED ORDER — PHENTERMINE HCL 37.5 MG PO CAPS
37.5000 mg | ORAL_CAPSULE | Freq: Every day | ORAL | 0 refills | Status: AC
  Filled 2023-06-30: qty 30, 30d supply, fill #0

## 2023-07-05 ENCOUNTER — Ambulatory Visit: Payer: BC Managed Care – PPO

## 2023-07-05 DIAGNOSIS — G35 Multiple sclerosis: Secondary | ICD-10-CM | POA: Diagnosis not present

## 2023-07-05 MED ORDER — GADOBENATE DIMEGLUMINE 529 MG/ML IV SOLN
19.0000 mL | Freq: Once | INTRAVENOUS | Status: AC | PRN
  Administered 2023-07-05: 19 mL via INTRAVENOUS

## 2023-07-06 ENCOUNTER — Encounter: Payer: Self-pay | Admitting: Neurology

## 2023-07-08 ENCOUNTER — Other Ambulatory Visit: Payer: Self-pay

## 2023-07-08 ENCOUNTER — Other Ambulatory Visit (HOSPITAL_BASED_OUTPATIENT_CLINIC_OR_DEPARTMENT_OTHER): Payer: Self-pay

## 2023-07-21 ENCOUNTER — Other Ambulatory Visit (HOSPITAL_BASED_OUTPATIENT_CLINIC_OR_DEPARTMENT_OTHER): Payer: Self-pay

## 2023-07-28 ENCOUNTER — Other Ambulatory Visit (HOSPITAL_BASED_OUTPATIENT_CLINIC_OR_DEPARTMENT_OTHER): Payer: Self-pay

## 2023-07-28 MED ORDER — BUPROPION HCL ER (SR) 150 MG PO TB12
150.0000 mg | ORAL_TABLET | Freq: Every day | ORAL | 0 refills | Status: AC
  Filled 2023-07-28: qty 30, 30d supply, fill #0

## 2023-07-28 MED ORDER — PHENTERMINE HCL 37.5 MG PO TABS
56.2500 mg | ORAL_TABLET | Freq: Every day | ORAL | 0 refills | Status: DC
Start: 2023-07-28 — End: 2023-08-31
  Filled 2023-07-28: qty 45, 30d supply, fill #0

## 2023-07-28 MED ORDER — ERGOCALCIFEROL 1.25 MG (50000 UT) PO CAPS
50000.0000 [IU] | ORAL_CAPSULE | ORAL | 0 refills | Status: AC
  Filled 2023-07-28: qty 12, 84d supply, fill #0

## 2023-07-28 MED ORDER — WEGOVY 2.4 MG/0.75ML ~~LOC~~ SOAJ
2.4000 mg | SUBCUTANEOUS | 0 refills | Status: DC
Start: 2023-07-28 — End: 2023-08-31
  Filled 2023-07-28: qty 3, 28d supply, fill #0

## 2023-08-31 ENCOUNTER — Other Ambulatory Visit (HOSPITAL_BASED_OUTPATIENT_CLINIC_OR_DEPARTMENT_OTHER): Payer: Self-pay

## 2023-08-31 MED ORDER — WEGOVY 2.4 MG/0.75ML ~~LOC~~ SOAJ
2.4000 mg | SUBCUTANEOUS | 0 refills | Status: DC
  Filled 2023-08-31 – 2023-09-19 (×3): qty 3, 28d supply, fill #0

## 2023-08-31 MED ORDER — BUPROPION HCL ER (SR) 150 MG PO TB12
150.0000 mg | ORAL_TABLET | Freq: Every day | ORAL | 0 refills | Status: AC
  Filled 2023-08-31: qty 30, 30d supply, fill #0

## 2023-08-31 MED ORDER — ERGOCALCIFEROL 1.25 MG (50000 UT) PO CAPS
50000.0000 [IU] | ORAL_CAPSULE | ORAL | 0 refills | Status: AC
  Filled 2023-08-31: qty 12, 84d supply, fill #0

## 2023-08-31 MED ORDER — PHENTERMINE HCL 37.5 MG PO TABS
56.2500 mg | ORAL_TABLET | Freq: Every day | ORAL | 0 refills | Status: DC
  Filled 2023-08-31: qty 45, 30d supply, fill #0

## 2023-09-01 ENCOUNTER — Other Ambulatory Visit: Payer: Self-pay

## 2023-09-01 ENCOUNTER — Other Ambulatory Visit (HOSPITAL_BASED_OUTPATIENT_CLINIC_OR_DEPARTMENT_OTHER): Payer: Self-pay

## 2023-09-02 ENCOUNTER — Other Ambulatory Visit (HOSPITAL_BASED_OUTPATIENT_CLINIC_OR_DEPARTMENT_OTHER): Payer: Self-pay

## 2023-09-05 ENCOUNTER — Other Ambulatory Visit (HOSPITAL_BASED_OUTPATIENT_CLINIC_OR_DEPARTMENT_OTHER): Payer: Self-pay

## 2023-09-06 ENCOUNTER — Other Ambulatory Visit (HOSPITAL_BASED_OUTPATIENT_CLINIC_OR_DEPARTMENT_OTHER): Payer: Self-pay

## 2023-09-07 ENCOUNTER — Other Ambulatory Visit (HOSPITAL_BASED_OUTPATIENT_CLINIC_OR_DEPARTMENT_OTHER): Payer: Self-pay

## 2023-09-08 ENCOUNTER — Other Ambulatory Visit (HOSPITAL_BASED_OUTPATIENT_CLINIC_OR_DEPARTMENT_OTHER): Payer: Self-pay

## 2023-09-13 ENCOUNTER — Other Ambulatory Visit (HOSPITAL_BASED_OUTPATIENT_CLINIC_OR_DEPARTMENT_OTHER): Payer: Self-pay

## 2023-09-14 ENCOUNTER — Other Ambulatory Visit (HOSPITAL_BASED_OUTPATIENT_CLINIC_OR_DEPARTMENT_OTHER): Payer: Self-pay

## 2023-09-15 ENCOUNTER — Other Ambulatory Visit (HOSPITAL_BASED_OUTPATIENT_CLINIC_OR_DEPARTMENT_OTHER): Payer: Self-pay

## 2023-09-19 ENCOUNTER — Other Ambulatory Visit (HOSPITAL_BASED_OUTPATIENT_CLINIC_OR_DEPARTMENT_OTHER): Payer: Self-pay

## 2023-09-19 ENCOUNTER — Other Ambulatory Visit: Payer: Self-pay

## 2023-09-20 ENCOUNTER — Other Ambulatory Visit (HOSPITAL_BASED_OUTPATIENT_CLINIC_OR_DEPARTMENT_OTHER): Payer: Self-pay

## 2023-09-21 ENCOUNTER — Other Ambulatory Visit: Payer: Self-pay

## 2023-09-21 ENCOUNTER — Other Ambulatory Visit (HOSPITAL_BASED_OUTPATIENT_CLINIC_OR_DEPARTMENT_OTHER): Payer: Self-pay

## 2023-09-22 ENCOUNTER — Other Ambulatory Visit (HOSPITAL_BASED_OUTPATIENT_CLINIC_OR_DEPARTMENT_OTHER): Payer: Self-pay

## 2023-09-28 ENCOUNTER — Other Ambulatory Visit (HOSPITAL_BASED_OUTPATIENT_CLINIC_OR_DEPARTMENT_OTHER): Payer: Self-pay

## 2023-09-28 MED ORDER — WEGOVY 2.4 MG/0.75ML ~~LOC~~ SOAJ
2.4000 mg | SUBCUTANEOUS | 0 refills | Status: DC
  Filled 2023-10-16: qty 3, 28d supply, fill #0

## 2023-09-28 MED ORDER — PHENTERMINE HCL 37.5 MG PO TABS
56.2500 mg | ORAL_TABLET | Freq: Every day | ORAL | 0 refills | Status: AC
  Filled 2023-09-30: qty 45, 30d supply, fill #0

## 2023-09-28 MED ORDER — ERGOCALCIFEROL 1.25 MG (50000 UT) PO CAPS
50000.0000 [IU] | ORAL_CAPSULE | ORAL | 0 refills | Status: AC
  Filled 2023-09-28 – 2024-04-24 (×2): qty 12, 84d supply, fill #0

## 2023-09-29 ENCOUNTER — Other Ambulatory Visit (HOSPITAL_BASED_OUTPATIENT_CLINIC_OR_DEPARTMENT_OTHER): Payer: Self-pay

## 2023-09-30 ENCOUNTER — Other Ambulatory Visit (HOSPITAL_BASED_OUTPATIENT_CLINIC_OR_DEPARTMENT_OTHER): Payer: Self-pay

## 2023-10-17 ENCOUNTER — Other Ambulatory Visit (HOSPITAL_BASED_OUTPATIENT_CLINIC_OR_DEPARTMENT_OTHER): Payer: Self-pay

## 2023-11-24 ENCOUNTER — Other Ambulatory Visit (HOSPITAL_BASED_OUTPATIENT_CLINIC_OR_DEPARTMENT_OTHER): Payer: Self-pay

## 2023-11-30 ENCOUNTER — Other Ambulatory Visit (HOSPITAL_BASED_OUTPATIENT_CLINIC_OR_DEPARTMENT_OTHER): Payer: Self-pay

## 2023-11-30 MED ORDER — PHENTERMINE HCL 37.5 MG PO CAPS
37.5000 mg | ORAL_CAPSULE | Freq: Every day | ORAL | 0 refills | Status: DC
  Filled 2023-11-30: qty 60, 60d supply, fill #0

## 2023-11-30 MED ORDER — WEGOVY 2.4 MG/0.75ML ~~LOC~~ SOAJ
2.4000 mg | SUBCUTANEOUS | 3 refills | Status: AC
  Filled 2023-11-30: qty 3, 28d supply, fill #0
  Filled 2023-12-29: qty 3, 28d supply, fill #1
  Filled 2024-01-25: qty 3, 28d supply, fill #2
  Filled 2024-02-24: qty 3, 28d supply, fill #3

## 2024-01-25 ENCOUNTER — Other Ambulatory Visit (HOSPITAL_BASED_OUTPATIENT_CLINIC_OR_DEPARTMENT_OTHER): Payer: Self-pay

## 2024-01-25 ENCOUNTER — Other Ambulatory Visit: Payer: Self-pay

## 2024-01-27 ENCOUNTER — Other Ambulatory Visit (HOSPITAL_BASED_OUTPATIENT_CLINIC_OR_DEPARTMENT_OTHER): Payer: Self-pay

## 2024-02-08 ENCOUNTER — Other Ambulatory Visit (HOSPITAL_BASED_OUTPATIENT_CLINIC_OR_DEPARTMENT_OTHER): Payer: Self-pay

## 2024-02-08 MED ORDER — PHENTERMINE HCL 37.5 MG PO CAPS
37.5000 mg | ORAL_CAPSULE | Freq: Every day | ORAL | 0 refills | Status: DC
  Filled 2024-02-08: qty 60, 60d supply, fill #0

## 2024-02-08 MED ORDER — WEGOVY 2.4 MG/0.75ML ~~LOC~~ SOAJ
2.4000 mg | SUBCUTANEOUS | 3 refills | Status: AC
  Filled 2024-03-23: qty 3, 28d supply, fill #0
  Filled 2024-05-29: qty 3, 28d supply, fill #1

## 2024-02-14 ENCOUNTER — Other Ambulatory Visit (HOSPITAL_COMMUNITY): Payer: Self-pay

## 2024-02-14 ENCOUNTER — Telehealth: Payer: Self-pay | Admitting: Neurology

## 2024-02-14 NOTE — Telephone Encounter (Signed)
Faxed to PA team.

## 2024-02-14 NOTE — Telephone Encounter (Signed)
 Pharmacy Patient Advocate Encounter   Received notification from Physician's Office that prior authorization for Dimethyl Fumerate is required/requested.   Insurance verification completed.   The patient is insured through [facility].   Per test claim: PA required; PA started via CoverMyMeds. KEY [id number] . Waiting for clinical questions to populate.

## 2024-02-14 NOTE — Telephone Encounter (Signed)
 [facility]) calling checking PA for Dimethyl Fumarate  240 MG CPDR

## 2024-02-16 ENCOUNTER — Other Ambulatory Visit (HOSPITAL_COMMUNITY): Payer: Self-pay

## 2024-02-16 ENCOUNTER — Other Ambulatory Visit (HOSPITAL_BASED_OUTPATIENT_CLINIC_OR_DEPARTMENT_OTHER): Payer: Self-pay

## 2024-02-16 NOTE — Telephone Encounter (Signed)
 Had to resubmit due ot clinical questions expiring-new KEY: [facility]

## 2024-02-22 ENCOUNTER — Other Ambulatory Visit (HOSPITAL_COMMUNITY): Payer: Self-pay

## 2024-02-22 NOTE — Telephone Encounter (Signed)
 [id number] have expired-resubmitted PA.

## 2024-02-22 NOTE — Telephone Encounter (Signed)
 Pharmacy Patient Advocate Encounter  Received notification from [facility] that Prior Authorization for Dimethyl Fumarate  has been APPROVED from 02/22/2024 to 02/21/2025   PA #/Case ID/Reference #: [id number]

## 2024-02-22 NOTE — Telephone Encounter (Signed)
 [facility] called and said they faxed over clinical questions on 10/10 to our fax number [phone] and haven't received anything back. I asked her to fax them again and she is going to check in again on Monday.

## 2024-02-22 NOTE — Telephone Encounter (Signed)
 Clinical questions have been answered and PA submitted. PA currently Pending. Please be advised that most companies allow up to 30 days to make a decision. We will advise when a determination has been made, or follow up in 1 week.   Please reach out to our team, Rx Prior Auth Pool, if you haven't heard back in a week.

## 2024-03-16 ENCOUNTER — Other Ambulatory Visit (HOSPITAL_BASED_OUTPATIENT_CLINIC_OR_DEPARTMENT_OTHER): Payer: Self-pay

## 2024-03-23 ENCOUNTER — Other Ambulatory Visit (HOSPITAL_BASED_OUTPATIENT_CLINIC_OR_DEPARTMENT_OTHER): Payer: Self-pay

## 2024-03-26 ENCOUNTER — Other Ambulatory Visit (HOSPITAL_BASED_OUTPATIENT_CLINIC_OR_DEPARTMENT_OTHER): Payer: Self-pay

## 2024-04-06 ENCOUNTER — Other Ambulatory Visit (HOSPITAL_BASED_OUTPATIENT_CLINIC_OR_DEPARTMENT_OTHER): Payer: Self-pay

## 2024-04-09 ENCOUNTER — Other Ambulatory Visit (HOSPITAL_BASED_OUTPATIENT_CLINIC_OR_DEPARTMENT_OTHER): Payer: Self-pay

## 2024-04-09 MED ORDER — PHENTERMINE HCL 37.5 MG PO CAPS
37.5000 mg | ORAL_CAPSULE | Freq: Every day | ORAL | 0 refills | Status: AC
  Filled 2024-04-09: qty 60, 60d supply, fill #0

## 2024-04-09 MED ORDER — WEGOVY 2.4 MG/0.75ML ~~LOC~~ SOAJ
2.4000 mg | SUBCUTANEOUS | 3 refills | Status: AC
  Filled 2024-04-24: qty 3, 28d supply, fill #0

## 2024-04-24 ENCOUNTER — Other Ambulatory Visit (HOSPITAL_BASED_OUTPATIENT_CLINIC_OR_DEPARTMENT_OTHER): Payer: Self-pay

## 2024-05-18 ENCOUNTER — Other Ambulatory Visit: Payer: Self-pay | Admitting: Neurology

## 2024-05-18 DIAGNOSIS — G35D Multiple sclerosis, unspecified: Secondary | ICD-10-CM

## 2024-05-18 NOTE — Telephone Encounter (Signed)
 Last seen on 06/23/23 Follow up scheduled on 06/28/24

## 2024-06-28 ENCOUNTER — Ambulatory Visit: Payer: BC Managed Care – PPO | Admitting: Neurology
